# Patient Record
Sex: Female | Born: 1970 | Race: Black or African American | Hispanic: No | Marital: Single | Smoking: Never smoker
Health system: Southern US, Community
[De-identification: ages and names within clinical notes are randomized; demographics above are authoritative.]

## PROBLEM LIST (undated history)

## (undated) DIAGNOSIS — I10 Essential (primary) hypertension: Secondary | ICD-10-CM

---

## 2014-03-11 DIAGNOSIS — J302 Other seasonal allergic rhinitis: Secondary | ICD-10-CM | POA: Insufficient documentation

## 2014-03-11 DIAGNOSIS — I10 Essential (primary) hypertension: Secondary | ICD-10-CM | POA: Insufficient documentation

## 2017-09-21 DIAGNOSIS — E782 Mixed hyperlipidemia: Secondary | ICD-10-CM | POA: Insufficient documentation

## 2020-05-30 ENCOUNTER — Emergency Department (INDEPENDENT_AMBULATORY_CARE_PROVIDER_SITE_OTHER): Payer: Self-pay

## 2020-05-30 ENCOUNTER — Emergency Department (INDEPENDENT_AMBULATORY_CARE_PROVIDER_SITE_OTHER)
Admission: EM | Admit: 2020-05-30 | Discharge: 2020-05-30 | Disposition: A | Discharge status: Home / Self Care | Payer: Self-pay | Source: Home / Self Care | Attending: Family Medicine | Admitting: Family Medicine

## 2020-05-30 ENCOUNTER — Other Ambulatory Visit: Payer: Self-pay

## 2020-05-30 DIAGNOSIS — S161XXA Strain of muscle, fascia and tendon at neck level, initial encounter: Secondary | ICD-10-CM

## 2020-05-30 DIAGNOSIS — M542 Cervicalgia: Secondary | ICD-10-CM

## 2020-05-30 DIAGNOSIS — M545 Low back pain: Secondary | ICD-10-CM

## 2020-05-30 DIAGNOSIS — M79602 Pain in left arm: Secondary | ICD-10-CM

## 2020-05-30 DIAGNOSIS — S39012A Strain of muscle, fascia and tendon of lower back, initial encounter: Secondary | ICD-10-CM

## 2020-05-30 MED ORDER — CYCLOBENZAPRINE HCL 10 MG PO TABS: 10.0000 mg | ORAL_TABLET | Freq: Three times a day (TID) | ORAL | 0 refills | Status: AC

## 2020-05-30 MED ORDER — IBUPROFEN 800 MG PO TABS
800.0000 mg | ORAL_TABLET | Freq: Once | ORAL | Status: AC
  Administered 2020-05-30: 800 mg via ORAL

## 2020-05-30 NOTE — Discharge Instructions (Signed)
Apply ice pack to neck muscles and lower back for 20 to 30 minutes, 3 to 4 times daily  Continue until pain and swelling decrease.  May take Ibuprofen 200mg , 4 tabs every 8 hours with food.   Wear soft cervical collar until improved. Begin range of motion and stretching exercises as tolerated.
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
## 2020-06-17 ENCOUNTER — Ambulatory Visit (INDEPENDENT_AMBULATORY_CARE_PROVIDER_SITE_OTHER): Payer: Self-pay | Admitting: Family Medicine

## 2020-06-17 ENCOUNTER — Encounter: Payer: Self-pay | Admitting: Family Medicine

## 2020-06-17 ENCOUNTER — Other Ambulatory Visit: Payer: Self-pay

## 2020-06-17 VITALS — BP 116/81 | HR 99 | Ht 67.0 in | Wt 285.0 lb

## 2020-06-17 DIAGNOSIS — M5440 Lumbago with sciatica, unspecified side: Secondary | ICD-10-CM | POA: Insufficient documentation

## 2020-06-17 DIAGNOSIS — M545 Low back pain, unspecified: Secondary | ICD-10-CM

## 2020-06-17 DIAGNOSIS — M542 Cervicalgia: Secondary | ICD-10-CM

## 2020-06-17 MED ORDER — METHYLPREDNISOLONE ACETATE 40 MG/ML IJ SUSP: 40.0000 mg | Freq: Once | INTRAMUSCULAR | Status: AC

## 2020-06-17 MED ORDER — IBUPROFEN-FAMOTIDINE 800-26.6 MG PO TABS: 1.0000 | ORAL_TABLET | Freq: Three times a day (TID) | ORAL | 3 refills | Status: AC | PRN
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
## 2020-06-17 NOTE — Assessment & Plan Note (Signed)
Likely related to spasm.  Specific trigger points appreciated in the bases especially on the right. -Counseled on home exercise therapy and supportive care. -Referral to physical therapy. -Could consider trigger point injections.

## 2020-06-17 NOTE — Patient Instructions (Signed)
Nice to meet you Please try heat  Please try the exercises  Please try the duexis  Physical therapy will give you a call   Please send me a message in MyChart with any questions or updates.  Please see me back in 4 weeks.   --Dr. Jordan Likes

## 2020-06-17 NOTE — Progress Notes (Signed)
Medication Samples have been provided to the patient.  Drug name: Duexis       Strength: 800mg /26.6mg         Qty: 1 box  LOT:  Exp.Date: 01/2021  Dosing instructions: take 1 tablet by mouth three (3) times a day.  The patient has been instructed regarding the correct time, dose, and frequency of taking this medication, including desired effects and most common side effects.   02/2021, MA 1:13 PM 06/17/2020

## 2020-06-18 ENCOUNTER — Telehealth: Payer: Self-pay | Admitting: Family Medicine

## 2020-06-24 ENCOUNTER — Emergency Department (INDEPENDENT_AMBULATORY_CARE_PROVIDER_SITE_OTHER)
Admission: EM | Admit: 2020-06-24 | Discharge: 2020-06-24 | Disposition: A | Discharge status: Home / Self Care | Payer: Self-pay | Source: Home / Self Care | Attending: Family Medicine | Admitting: Family Medicine

## 2020-06-24 ENCOUNTER — Other Ambulatory Visit: Payer: Self-pay

## 2020-06-24 ENCOUNTER — Encounter: Payer: Self-pay | Admitting: Emergency Medicine

## 2020-06-24 ENCOUNTER — Emergency Department (INDEPENDENT_AMBULATORY_CARE_PROVIDER_SITE_OTHER): Payer: BC Managed Care – PPO

## 2020-06-24 DIAGNOSIS — M7061 Trochanteric bursitis, right hip: Secondary | ICD-10-CM

## 2020-06-24 DIAGNOSIS — M25551 Pain in right hip: Secondary | ICD-10-CM | POA: Diagnosis not present

## 2020-06-24 DIAGNOSIS — M76891 Other specified enthesopathies of right lower limb, excluding foot: Secondary | ICD-10-CM

## 2020-06-24 HISTORY — DX: Essential (primary) hypertension: I10

## 2020-06-24 MED ORDER — PREDNISONE 20 MG PO TABS: ORAL_TABLET | ORAL | 0 refills | Status: AC

## 2020-06-24 NOTE — Discharge Instructions (Signed)
Apply ice pack for 20 to 30 minutes, 3 to 4 times daily  Continue until pain decreases.  May take Extra Strength Tylenol 2 to 3 times daily as needed. Begin range of motion and stretching exercises as tolerated.
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
## 2020-06-24 NOTE — ED Triage Notes (Signed)
Follow up from [facility] on 8/19 - initially had back pain  Saw sports med doctor at [facility] - referred for PT  1st appointment is on Monday Now has increased R leg pain - spasm in her hip radiates to thigh Motrin 800mg  q 8 hours - min relief NO COVID vaccine

## 2020-06-25 ENCOUNTER — Encounter: Payer: Self-pay | Admitting: Family Medicine

## 2020-06-25 ENCOUNTER — Other Ambulatory Visit: Payer: Self-pay

## 2020-06-25 ENCOUNTER — Ambulatory Visit (INDEPENDENT_AMBULATORY_CARE_PROVIDER_SITE_OTHER): Payer: Self-pay | Admitting: Family Medicine

## 2020-06-25 VITALS — BP 128/81 | HR 94 | Ht 67.0 in | Wt 285.0 lb

## 2020-06-25 DIAGNOSIS — M5441 Lumbago with sciatica, right side: Secondary | ICD-10-CM

## 2020-06-25 NOTE — Progress Notes (Signed)
Renee Mcgrath - 49 y.o. female MRN [id number]  Date of birth: 08/09/1971  SUBJECTIVE:  Including CC & ROS.  Chief Complaint  Patient presents with  . Follow-up    right-sided low back    Renee Mcgrath is a 49 y.o. female that is presenting with right leg pain.  She is seen in urgent care yesterday and had normal x-rays.  She was prescribed prednisone has noticed a significant improvement of her pain.  She was having lateral hip pain with radicular symptoms down the leg.  She denies any significant pain today.  Is feeling much improved compared to yesterday..  Independent review of the right hip x-ray and AP of the pelvis shows no acute abnormality.  Review of Systems See HPI   HISTORY: Past Medical, Surgical, Social, and Family History Reviewed & Updated per EMR.   Pertinent Historical Findings include:  Past Medical History:  Diagnosis Date  . Hypertension     No past surgical history on file.  Family History  Problem Relation Age of Onset  . Cancer Mother   . Multiple sclerosis Father   . Stroke Father     Social History   Socioeconomic History  . Marital status: Single    Spouse name: Not on file  . Number of children: Not on file  . Years of education: Not on file  . Highest education level: Not on file  Occupational History  . Not on file  Tobacco Use  . Smoking status: Never Smoker  . Smokeless tobacco: Never Used  Vaping Use  . Vaping Use: Never used  Substance and Sexual Activity  . Alcohol use: Not Currently  . Drug use: Never  . Sexual activity: Not on file  Other Topics Concern  . Not on file  Social History Narrative  . Not on file   Social Determinants of Health   Financial Resource Strain:   . Difficulty of Paying Living Expenses: Not on file  Food Insecurity:   . Worried About Programme researcher, broadcasting/film/video in the Last Year: Not on file  . Ran Out of Food in the Last Year: Not on file  Transportation Needs:   . Lack of Transportation  (Medical): Not on file  . Lack of Transportation (Non-Medical): Not on file  Physical Activity:   . Days of Exercise per Week: Not on file  . Minutes of Exercise per Session: Not on file  Stress:   . Feeling of Stress : Not on file  Social Connections:   . Frequency of Communication with Friends and Family: Not on file  . Frequency of Social Gatherings with Friends and Family: Not on file  . Attends Religious Services: Not on file  . Active Member of Clubs or Organizations: Not on file  . Attends Banker Meetings: Not on file  . Marital Status: Not on file  Intimate Partner Violence:   . Fear of Current or Ex-Partner: Not on file  . Emotionally Abused: Not on file  . Physically Abused: Not on file  . Sexually Abused: Not on file     PHYSICAL EXAM:  VS: BP 128/81   Pulse 94   Ht 5\' 7"  (1.702 m)   Wt 285 lb (129.3 kg)   LMP 06/10/2020 (Approximate)   BMI 44.64 kg/m  Physical Exam Gen: NAD, alert, cooperative with exam, well-appearing  ASSESSMENT & PLAN:   Acute bilateral low back pain with sciatica Pain appears to be more radicular nature on the  right side that improved with the prednisone use. -Counseled on supportive care. -Has physical therapy scheduled. -We will plan on scheduled follow-up.

## 2020-06-25 NOTE — Assessment & Plan Note (Signed)
Pain appears to be more radicular nature on the right side that improved with the prednisone use. -Counseled on supportive care. -Has physical therapy scheduled. -We will plan on scheduled follow-up.

## 2020-06-30 ENCOUNTER — Ambulatory Visit (INDEPENDENT_AMBULATORY_CARE_PROVIDER_SITE_OTHER): Payer: Self-pay | Admitting: Rehabilitative and Restorative Service Providers"

## 2020-06-30 ENCOUNTER — Other Ambulatory Visit: Payer: Self-pay

## 2020-06-30 ENCOUNTER — Encounter: Payer: Self-pay | Admitting: Rehabilitative and Restorative Service Providers"

## 2020-06-30 DIAGNOSIS — R293 Abnormal posture: Secondary | ICD-10-CM

## 2020-06-30 DIAGNOSIS — M542 Cervicalgia: Secondary | ICD-10-CM

## 2020-06-30 DIAGNOSIS — M5441 Lumbago with sciatica, right side: Secondary | ICD-10-CM

## 2020-06-30 DIAGNOSIS — R29898 Other symptoms and signs involving the musculoskeletal system: Secondary | ICD-10-CM
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
## 2020-06-30 NOTE — Patient Instructions (Signed)
Access Code: [email or web address]: [email or web address]: 09/20/2021Prepared by: Karely Hurtado HoltExercises  Supine Piriformis Stretch with Leg Straight - 2 x daily - 7 x weekly - 1 sets - 3 reps - 30 sec hold  Hooklying Hamstring Stretch with Strap - 2 x daily - 7 x weekly - 1 sets - 3 reps - 30 sec hold  Supine ITB Stretch with Strap - 2 x daily - 7 x weekly - 1 sets - 3 reps - 30 sec hold  Prone Quadriceps Stretch with Strap - 2 x daily - 7 x weekly - 1 sets - 3 reps - 30 sec hold  Baby Cobra Hands Down - 2 x daily - 7 x weekly - 1 sets - 5-10 reps - 2-3 sec hold  Seated Cervical Retraction - 2 x daily - 7 x weekly - 1-2 sets - 5-10 reps - 10 sec hold  Seated Cervical Sidebending AROM - 2 x daily - 7 x weekly - 1 sets - 5 reps - 5-10 sec hold Patient Education  TENS Unit

## 2020-07-02 ENCOUNTER — Ambulatory Visit (INDEPENDENT_AMBULATORY_CARE_PROVIDER_SITE_OTHER): Payer: BC Managed Care – PPO | Admitting: Rehabilitative and Restorative Service Providers"

## 2020-07-02 ENCOUNTER — Other Ambulatory Visit: Payer: Self-pay

## 2020-07-02 ENCOUNTER — Encounter: Payer: Self-pay | Admitting: Rehabilitative and Restorative Service Providers"

## 2020-07-02 DIAGNOSIS — M5441 Lumbago with sciatica, right side: Secondary | ICD-10-CM

## 2020-07-02 DIAGNOSIS — R29898 Other symptoms and signs involving the musculoskeletal system: Secondary | ICD-10-CM

## 2020-07-02 DIAGNOSIS — R293 Abnormal posture: Secondary | ICD-10-CM

## 2020-07-02 DIAGNOSIS — M542 Cervicalgia: Secondary | ICD-10-CM | POA: Diagnosis not present

## 2020-07-02 NOTE — Therapy (Signed)
[facility] [place] [address] [place], [place], [address] Phone: [phone]   Fax:  [phone]  Physical Therapy Treatment  Patient Details  Name: Renee Mcgrath MRN: [id number] Date of Birth: November 02, 1970 Referring Provider (PT): Dr Clare Gandy   Encounter Date: 07/02/2020   PT End of Session - 07/02/20 1522    Visit Number 2    Number of Visits 12    Date for PT Re-Evaluation 08/11/20    PT Start Time 1521    PT Stop Time 1610    PT Time Calculation (min) 49 min    Activity Tolerance Patient tolerated treatment well           Past Medical History:  Diagnosis Date  . Hypertension     History reviewed. No pertinent surgical history.  There were no vitals filed for this visit.   Subjective Assessment - 07/02/20 1525    Subjective Patient reports that she had a lot of soreness following initial evaluation. She has not done her exercises due to late work night yesterday. Still having a lot of popping and cracking in the neck with movement.    Currently in Pain? Yes    Pain Score 4     Pain Location Back    Pain Orientation Right;Lower    Pain Descriptors / Indicators Aching;Nagging    Pain Score 6    Pain Location Neck    Pain Orientation Right;Left;Lower    Pain Descriptors / Indicators Dull;Aching;Sharp   occasional sharp pain with movement                            [facility] Adult PT Treatment/Exercise - 07/02/20 0001      Self-Care   Self-Care --   transition sit to supine via sidelying      Therapeutic Activites    Therapeutic Activities --   myofacial ball release upper body/LB/posterior hip standing      Neck Exercises: Standing   Neck Retraction 5 reps;5 secs   back along noodle      Lumbar Exercises: Stretches   Passive Hamstring Stretch Right;Left;2 reps;30 seconds   supine with strap    Hip Flexor Stretch Right;Left;2 reps;30 seconds   seated   Press Ups 5 reps   2-3 sec  hold - limited to pt tolerance    Quad Stretch Right;2 reps;30 seconds   prone with strap    ITB Stretch Right;2 reps;30 seconds   supine with strap    Piriformis Stretch Right;2 reps;30 seconds   supine travell stretch      Lumbar Exercises: Aerobic   Nustep L5 x 5 min UE 10       Shoulder Exercises: Standing   Retraction AROM;Strengthening;Both;10 reps   10 sec hold with noodle    Other Standing Exercises shoulder rolls backwards x 20       Shoulder Exercises: Stretch   Other Shoulder Stretches doorway stretch 2 lower positions 30 sec x 2 reps; higher position 30 sec x 2 reps just resting arms on doorway       Moist Heat Therapy   Number Minutes Moist Heat 10 Minutes    Moist Heat Location Cervical;Lumbar Spine      Electrical Stimulation   Electrical Stimulation Location bilat cervical/upper trap      Electrical Stimulation Action TENs     Electrical Stimulation Parameters to tolerance    Electrical Stimulation Goals Pain;Tone  Neck Exercises: Stretches   Other Neck Stretches lateral cervical flexion 5 sec hold x 3 reps each side                   PT Education - 07/02/20 1539    Education Details HEP    Person(s) Educated Patient    Methods Explanation;Demonstration;Tactile cues;Verbal cues;Handout    Comprehension Verbalized understanding;Returned demonstration;Verbal cues required;Tactile cues required               PT Long Term Goals - 06/30/20 1307      PT LONG TERM GOAL #1   Title Improve cervical and lumbar ROM to 60-75% in all ranges with minimal to no pain    Time 6    Period Weeks    Status New    Target Date 08/11/20      PT LONG TERM GOAL #2   Title Decrease pain to no more than 2/10 pain on the 0-10 pain scape with functional activities at work and home    Time 6    Period Weeks    Status New    Target Date 08/11/20      PT LONG TERM GOAL #3   Title Patient to report ability to sit or stand for 30-60 min with minimal to no  increase pain    Time 6    Period Weeks    Status New    Target Date 08/11/20      PT LONG TERM GOAL #4   Title Independent in HEP    Time 6    Period Weeks    Status New    Target Date 08/11/20      PT LONG TERM GOAL #5   Title Improve FOTO to </= 39% limitation    Time 6    Period Weeks    Status New    Target Date 08/11/20                 Plan - 07/02/20 1528    Clinical Impression Statement Persistent pain in Rt > Lt cervical and shoudler girdle areas as well as Rt lumbar to posterior hip and anterior thigh. Patient has not worked on her exercises at home due to work schedule. Reviewed exercises today and added exercises without difficulty. Trial of TENS for home use for pain management.    Rehab Potential Good    PT Frequency 2x / week    PT Duration 6 weeks    PT Treatment/Interventions ADLs/Self Care Home Management;Aquatic Therapy;Cryotherapy;Electrical Stimulation;Iontophoresis 4mg /ml Dexamethasone;Moist Heat;Ultrasound;Gait training;Stair training;Functional mobility training;Therapeutic activities;Therapeutic exercise;Balance training;Neuromuscular re-education;Patient/family education;Manual techniques;Passive range of motion;Taping    PT Next Visit Plan review HEP; progress with core strengthening and stabilization; modalities as indicated - trial of TENS for home use; manual work as indicated    PT Home Exercise Plan [email or web address]    Consulted and Agree with Plan of Care Patient           Patient will benefit from skilled therapeutic intervention in order to improve the following deficits and impairments:     Visit Diagnosis: Acute bilateral low back pain with right-sided sciatica  Cervicalgia  Other symptoms and signs involving the musculoskeletal system  Abnormal posture     Problem List Patient Active Problem List   Diagnosis Date Noted  . Acute bilateral low back pain with sciatica 06/17/2020  . Neck pain 06/17/2020  . Mixed hyperlipidemia  09/21/2017  . Benign essential hypertension 03/11/2014  . Seasonal allergic rhinitis  03/11/2014  . Plantar fasciitis of left foot 03/30/2013    Kaylea Mounsey Rober Minion PT, MPH  07/02/2020, 4:07 PM  [facility] [address] [place], [place], [address] Phone: [phone]   Fax:  [phone]  Name: Renee Mcgrath MRN: [id number] Date of Birth: [birth year]

## 2020-07-07 ENCOUNTER — Encounter: Payer: Self-pay | Admitting: Physical Therapy

## 2020-07-07 ENCOUNTER — Ambulatory Visit: Payer: BC Managed Care – PPO | Admitting: Physical Therapy

## 2020-07-07 ENCOUNTER — Other Ambulatory Visit: Payer: Self-pay

## 2020-07-07 DIAGNOSIS — R293 Abnormal posture: Secondary | ICD-10-CM | POA: Diagnosis not present

## 2020-07-07 DIAGNOSIS — M542 Cervicalgia: Secondary | ICD-10-CM | POA: Diagnosis not present

## 2020-07-07 DIAGNOSIS — M5441 Lumbago with sciatica, right side: Secondary | ICD-10-CM | POA: Diagnosis not present

## 2020-07-07 DIAGNOSIS — R29898 Other symptoms and signs involving the musculoskeletal system: Secondary | ICD-10-CM | POA: Diagnosis not present

## 2020-07-07 NOTE — Therapy (Signed)
[place], [place], [address] Phone: [phone]   Fax:  [phone]  Physical Therapy Treatment  Patient Details  Name: Renee Mcgrath MRN: [id number] Date of Birth: 12/21/1970 Referring Provider (PT): Dr Clearance Coots   Encounter Date: 07/07/2020   PT End of Session - 07/07/20 0810    Visit Number 3    Number of Visits 12    Date for PT Re-Evaluation 08/11/20    PT Start Time 0807    PT Stop Time 0846    PT Time Calculation (min) 39 min    Activity Tolerance Patient tolerated treatment well;No increased pain    Behavior During Therapy WFL for tasks assessed/performed           Past Medical History:  Diagnosis Date  . Hypertension     History reviewed. No pertinent surgical history.  There were no vitals filed for this visit.   Subjective Assessment - 07/07/20 0810    Subjective Pt reports she has done her exercises, but hasn't noticed any changes in pain yet. She didn't sleep well last night. The TENS relaxed her, but she hasn't ordered it yet. She is thinking of returning to [place] and yoga classes.    Pertinent History denies any medical or musculoskeletal problems    Patient Stated Goals get rid of the pain and return to PLOF    Currently in Pain? Yes    Pain Score 4     Pain Location Back    Pain Orientation Lower    Pain Descriptors / Indicators Aching    Aggravating Factors  prolonged sitting or standing; bending    Pain Relieving Factors meds, heat    Pain Score 3    Pain Location Neck    Pain Orientation Right;Left;Lower    Pain Descriptors / Indicators Aching;Dull    Aggravating Factors  end of the day; reaching, bending    Pain Relieving Factors medications              [facility] PT Assessment - 07/07/20 0001      Assessment   Medical Diagnosis LBP with Rt LE pain; cervicalgia     Referring Provider (PT) Dr Clearance Coots    Onset Date/Surgical Date 05/29/20      Hand Dominance Right    Next MD Visit 07/17/20    Prior Therapy none              [facility] Adult PT Treatment/Exercise - 07/07/20 0001      Lumbar Exercises: Stretches   Passive Hamstring Stretch Right;Left;2 reps;20 seconds   seated   Quad Stretch Right;1 rep;20 seconds   trial of seated with foot under chair   Quad Stretch Limitations bother neck;  stopped   Piriformis Stretch Right;Left;2 reps   modified pigeon pose on table   Other Lumbar Stretch Exercise L stretch at counter x 10 sec, seated bilat shoulder flexion x 10 sec.       Lumbar Exercises: Aerobic   Nustep L5 x 6.5 min UE 10       Shoulder Exercises: Seated   Row Both;10 reps;Strengthening   3-5 sec hold in retraction   Theraband Level (Shoulder Row) Level 2 (Red)    Row Limitations cues for form       Shoulder Exercises: Stretch   Other Shoulder Stretches 3 position doorway stretch x 20 sec x 2 reps each position      Modalities   Modalities --  deferred due to time.                       PT Long Term Goals - 06/30/20 1307      PT LONG TERM GOAL #1   Title Improve cervical and lumbar ROM to 60-75% in all ranges with minimal to no pain    Time 6    Period Weeks    Status New    Target Date 08/11/20      PT LONG TERM GOAL #2   Title Decrease pain to no more than 2/10 pain on the 0-10 pain scape with functional activities at work and home    Time 6    Period Weeks    Status New    Target Date 08/11/20      PT LONG TERM GOAL #3   Title Patient to report ability to sit or stand for 30-60 min with minimal to no increase pain    Time 6    Period Weeks    Status New    Target Date 08/11/20      PT LONG TERM GOAL #4   Title Independent in HEP    Time 6    Period Weeks    Status New    Target Date 08/11/20      PT LONG TERM GOAL #5   Title Improve FOTO to </= 39% limitation    Time 6    Period Weeks    Status New    Target Date 08/11/20                 Plan - 07/07/20  0831    Clinical Impression Statement Pt shown variations on stretches to incorporate into her work day; all were tolerated well.  She reported slight reduction in discomfort after performing exercises.  Minor cues on form of exercises required. Goals are ongoing.    Comorbidities cervical and shoulder pain; LBP with radiculopathy; obesity    Examination-Activity Limitations Lift;Stand;Squat;Sit;Sleep;Reach Overhead    Rehab Potential Good    PT Frequency 2x / week    PT Duration 6 weeks    PT Treatment/Interventions ADLs/Self Care Home Management;Aquatic Therapy;Cryotherapy;Electrical Stimulation;Iontophoresis 4mg /ml Dexamethasone;Moist Heat;Ultrasound;Gait training;Stair training;Functional mobility training;Therapeutic activities;Therapeutic exercise;Balance training;Neuromuscular re-education;Patient/family education;Manual techniques;Passive range of motion;Taping    PT Next Visit Plan progress with core strengthening and stabilization; modalities as indicated.    PT Home Exercise Plan [email or web address]    Consulted and Agree with Plan of Care Patient           Patient will benefit from skilled therapeutic intervention in order to improve the following deficits and impairments:  Decreased range of motion, Increased muscle spasms, Obesity, Decreased activity tolerance, Pain, Hypomobility, Impaired flexibility, Improper body mechanics, Decreased mobility, Decreased strength, Impaired sensation, Postural dysfunction  Visit Diagnosis: Acute bilateral low back pain with right-sided sciatica  Cervicalgia  Other symptoms and signs involving the musculoskeletal system  Abnormal posture     Problem List Patient Active Problem List   Diagnosis Date Noted  . Acute bilateral low back pain with sciatica 06/17/2020  . Neck pain 06/17/2020  . Mixed hyperlipidemia 09/21/2017  . Benign essential hypertension 03/11/2014  . Seasonal allergic rhinitis 03/11/2014  . Plantar fasciitis of left foot  03/30/2013   Kerin Perna, PTA 07/07/20 8:58 AM  [facility] [place], [place], [address] Phone: [phone]   Fax:  [phone]  Name: SARANNE CRISLIP MRN: [id number]  Date of Birth: 13-Jul-1971

## 2020-07-09 ENCOUNTER — Encounter: Payer: Self-pay | Admitting: Rehabilitative and Restorative Service Providers"

## 2020-07-09 ENCOUNTER — Other Ambulatory Visit: Payer: Self-pay

## 2020-07-09 ENCOUNTER — Ambulatory Visit (INDEPENDENT_AMBULATORY_CARE_PROVIDER_SITE_OTHER): Payer: BC Managed Care – PPO | Admitting: Rehabilitative and Restorative Service Providers"

## 2020-07-09 DIAGNOSIS — R29898 Other symptoms and signs involving the musculoskeletal system: Secondary | ICD-10-CM

## 2020-07-09 DIAGNOSIS — M542 Cervicalgia: Secondary | ICD-10-CM | POA: Diagnosis not present

## 2020-07-09 DIAGNOSIS — R293 Abnormal posture: Secondary | ICD-10-CM

## 2020-07-09 DIAGNOSIS — M5441 Lumbago with sciatica, right side: Secondary | ICD-10-CM

## 2020-07-09 NOTE — Patient Instructions (Signed)
Access Code: [email or web address]: [email or web address]: 09/29/2021Prepared by: Kareena Arrambide HoltExercises  Supine Piriformis Stretch with Leg Straight - 2 x daily - 7 x weekly - 1 sets - 3 reps - 30 sec hold  Hooklying Hamstring Stretch with Strap - 2 x daily - 7 x weekly - 1 sets - 3 reps - 30 sec hold  Supine ITB Stretch with Strap - 2 x daily - 7 x weekly - 1 sets - 3 reps - 30 sec hold  Prone Quadriceps Stretch with Strap - 2 x daily - 7 x weekly - 1 sets - 3 reps - 30 sec hold  Baby Cobra Hands Down - 2 x daily - 7 x weekly - 1 sets - 5-10 reps - 2-3 sec hold  Seated Cervical Retraction - 2 x daily - 7 x weekly - 1-2 sets - 5-10 reps - 10 sec hold  Seated Cervical Sidebending AROM - 2 x daily - 7 x weekly - 1 sets - 5 reps - 5-10 sec hold  Doorway Pec Stretch at 60 Degrees Abduction - 3 x daily - 7 x weekly - 3 reps - 1 sets  Doorway Pec Stretch at 90 Degrees Abduction - 3 x daily - 7 x weekly - 3 reps - 1 sets - 30 seconds hold  Doorway Pec Stretch at 120 Degrees Abduction - 3 x daily - 7 x weekly - 3 reps - 1 sets - 30 second hold hold  Seated Scapular Retraction - 2 x daily - 7 x weekly - 1-2 sets - 10 reps - 10 sec hold  Standing Shoulder External Rotation with Resistance - 2 x daily - 7 x weekly - 1-3 sets - 10 reps - 2-3 sec hold  Standing Bilateral Low Shoulder Row with Anchored Resistance - 2 x daily - 7 x weekly - 1-3 sets - 10 reps - 2-3 sec hold  Shoulder Extension with Resistance - 2 x daily - 7 x weekly - 1-3 sets - 10 reps - 2-3 sec hold  Supine March with Alternating Leg Lifts - 2 x daily - 7 x weekly - 1 sets - 10 reps - 2 sec hold  Supine Alternating Shoulder Flexion - 2 x daily - 7 x weekly - 1-2 sets - 10 reps - 2 sec hold  Dead Bug - 2 x daily - 7 x weekly - 1-2 sets - 10 reps - 2 sec hold  Seated Sidebending - 2 x daily - 7 x weekly - 1 sets - 3 reps - 30 sec hold  Prone Quadriceps Stretch with Strap - 2 x daily - 7 x weekly - 1 sets - 3 reps - 30 sec hold

## 2020-07-09 NOTE — Therapy (Signed)
[facility] [place] [address] [place], [place], [address] Phone: [phone]   Fax:  [phone]  Physical Therapy Treatment  Patient Details  Name: Renee Mcgrath MRN: [id number] Date of Birth: [birth year] Referring Provider (PT): Dr Clare Gandy   Encounter Date: 07/09/2020   PT End of Session - 07/09/20 1509    Visit Number 4    Number of Visits 12    Date for PT Re-Evaluation 08/11/20    PT Start Time 1509    PT Stop Time 1559    PT Time Calculation (min) 50 min    Activity Tolerance Patient tolerated treatment well;No increased pain           Past Medical History:  Diagnosis Date  . Hypertension     History reviewed. No pertinent surgical history.  There were no vitals filed for this visit.   Subjective Assessment - 07/09/20 1510    Subjective Had some leg pain last night. Has not had leg pain since she took the steroids. She does not know of anything different yesterday that may have effected the leg pain. It lasted for about an hour and it was resolved by morning. Exercises are going OK. Back feels "pretty good" today. She has some occasional neck pain and shoulder tightness. Overall making progress. Has not ordered TENS unit.    Currently in Pain? Yes    Pain Score 3     Pain Location Back    Pain Orientation Lower    Pain Descriptors / Indicators Aching    Pain Score 2    Pain Location Neck    Pain Orientation Right;Left;Lower    Pain Descriptors / Indicators Aching;Dull    Pain Type Acute pain                             [facility] Adult PT Treatment/Exercise - 07/09/20 0001      Lumbar Exercises: Stretches   Passive Hamstring Stretch Right;Left;2 reps;20 seconds   seated   Quad Stretch Right;Left;2 reps;30 seconds   prone with strap    ITB Stretch Right;Left;2 reps;30 seconds    Piriformis Stretch Right;Left;2 reps   modified pigeon pose on table   Other Lumbar Stretch Exercise  lateral trunk flexion in sitting 15-20 sec hold x 3 reps       Lumbar Exercises: Aerobic   Nustep L5 x 6 min UE 10     Other Aerobic Exercise 2 laps around gym end of treatment       Lumbar Exercises: Supine   Bent Knee Raise 5 reps    Dead Bug 10 reps;2 seconds    Bridge Limitations increased pain Rt posterior hip     Other Supine Lumbar Exercises clam w/ green TB alternate hip abduction in hooklying     Other Supine Lumbar Exercises alternate shoulder flexion in supine x 10       Shoulder Exercises: Standing   Extension Strengthening;Both;10 reps;Theraband    Theraband Level (Shoulder Extension) Level 3 (Green)    Row Strengthening;Both;10 reps;Theraband    Theraband Level (Shoulder Row) Level 3 (Green)    Retraction Strengthening;Both;10 reps;Theraband    Theraband Level (Shoulder Retraction) Level 2 (Red)      Shoulder Exercises: Stretch   Other Shoulder Stretches 3 position doorway stretch x 20 sec x 2 reps each position      Moist Heat Therapy   Number Minutes Moist Heat 10 Minutes  Moist Heat Location Cervical;Lumbar Spine      Electrical Stimulation   Electrical Stimulation Location Rt thoracic spine/shoulder girdle area     Electrical Stimulation Action TENS    Electrical Stimulation Parameters to tolerance     Electrical Stimulation Goals Pain;Tone                  PT Education - 07/09/20 1550    Education Details HEP    Person(s) Educated Patient    Methods Explanation;Demonstration;Tactile cues;Verbal cues;Handout    Comprehension Verbalized understanding;Returned demonstration;Verbal cues required;Tactile cues required               PT Long Term Goals - 06/30/20 1307      PT LONG TERM GOAL #1   Title Improve cervical and lumbar ROM to 60-75% in all ranges with minimal to no pain    Time 6    Period Weeks    Status New    Target Date 08/11/20      PT LONG TERM GOAL #2   Title Decrease pain to no more than 2/10 pain on the 0-10 pain  scape with functional activities at work and home    Time 6    Period Weeks    Status New    Target Date 08/11/20      PT LONG TERM GOAL #3   Title Patient to report ability to sit or stand for 30-60 min with minimal to no increase pain    Time 6    Period Weeks    Status New    Target Date 08/11/20      PT LONG TERM GOAL #4   Title Independent in HEP    Time 6    Period Weeks    Status New    Target Date 08/11/20      PT LONG TERM GOAL #5   Title Improve FOTO to </= 39% limitation    Time 6    Period Weeks    Status New    Target Date 08/11/20                 Plan - 07/09/20 1537    Clinical Impression Statement Some Rt anterior thigh pain last night. Resolved with rest. Added stretches and resistive exercises without difficulty. issued TB for home and work.    Rehab Potential Good    PT Frequency 2x / week    PT Duration 6 weeks    PT Treatment/Interventions ADLs/Self Care Home Management;Aquatic Therapy;Cryotherapy;Electrical Stimulation;Iontophoresis 4mg /ml Dexamethasone;Moist Heat;Ultrasound;Gait training;Stair training;Functional mobility training;Therapeutic activities;Therapeutic exercise;Balance training;Neuromuscular re-education;Patient/family education;Manual techniques;Passive range of motion;Taping    PT Next Visit Plan progress with core strengthening and stabilization; modalities as indicated.    PT Home Exercise Plan [email or web address]    Consulted and Agree with Plan of Care Patient           Patient will benefit from skilled therapeutic intervention in order to improve the following deficits and impairments:     Visit Diagnosis: Acute bilateral low back pain with right-sided sciatica  Cervicalgia  Other symptoms and signs involving the musculoskeletal system  Abnormal posture     Problem List Patient Active Problem List   Diagnosis Date Noted  . Acute bilateral low back pain with sciatica 06/17/2020  . Neck pain 06/17/2020  . Mixed  hyperlipidemia 09/21/2017  . Benign essential hypertension 03/11/2014  . Seasonal allergic rhinitis 03/11/2014  . Plantar fasciitis of left foot 03/30/2013    Tita Terhaar P Breckyn Ticas PT,  MPH  07/09/2020, 4:01 PM  [facility] [address] [place], [place], [address] Phone: [phone]   Fax:  [phone]  Name: KYRSTIN CAMPILLO MRN: [id number] Date of Birth: [birth year]

## 2020-07-14 ENCOUNTER — Encounter: Payer: Self-pay | Admitting: Rehabilitative and Restorative Service Providers"

## 2020-07-14 ENCOUNTER — Other Ambulatory Visit: Payer: Self-pay

## 2020-07-14 ENCOUNTER — Ambulatory Visit (INDEPENDENT_AMBULATORY_CARE_PROVIDER_SITE_OTHER): Payer: BC Managed Care – PPO | Admitting: Rehabilitative and Restorative Service Providers"

## 2020-07-14 DIAGNOSIS — R293 Abnormal posture: Secondary | ICD-10-CM

## 2020-07-14 DIAGNOSIS — R29898 Other symptoms and signs involving the musculoskeletal system: Secondary | ICD-10-CM | POA: Diagnosis not present

## 2020-07-14 DIAGNOSIS — M5441 Lumbago with sciatica, right side: Secondary | ICD-10-CM | POA: Diagnosis not present

## 2020-07-14 DIAGNOSIS — M542 Cervicalgia: Secondary | ICD-10-CM | POA: Diagnosis not present

## 2020-07-14 NOTE — Therapy (Signed)
[facility] [place] [address] [place], [place], [address] Phone: [phone]   Fax:  [phone]  Physical Therapy Treatment  Patient Details  Name: SIENA POEHLER MRN: [id number] Date of Birth: October 14, 1970 Referring Provider (PT): Dr Clare Gandy   Encounter Date: 07/14/2020   PT End of Session - 07/14/20 1612    Visit Number 5    Number of Visits 12    Date for PT Re-Evaluation 08/11/20    PT Start Time 1609    PT Stop Time 1654    PT Time Calculation (min) 45 min    Activity Tolerance Patient tolerated treatment well;No increased pain           Past Medical History:  Diagnosis Date  . Hypertension     History reviewed. No pertinent surgical history.  There were no vitals filed for this visit.   Subjective Assessment - 07/14/20 1613    Subjective Patient reports that she has increased tightness and pain in the midback - may be from increased time at the computer.    Currently in Pain? No/denies    Pain Score 0-No pain    Pain Location Back    Pain Score 5    Pain Location Neck    Pain Orientation Mid;Right;Left    Pain Descriptors / Indicators Aching;Dull    Pain Radiating Towards upper trap Rt > Lt and thoracic area                             [facility] Adult PT Treatment/Exercise - 07/14/20 0001      Therapeutic Activites    Therapeutic Activities --   myofacial ball release work through thoracic spine standing     Lumbar Exercises: Prone   Other Prone Lumbar Exercises thoracic extension UE sec x 10; W 5 sec x 5; airplane 5 sec x 5 cervical spine in neutral position 's at side       Shoulder Exercises: Seated   Other Seated Exercises thoracic extension hands behind head leaning back 10 - 15 sec x 3 reps       Moist Heat Therapy   Number Minutes Moist Heat 10 Minutes    Moist Heat Location --   thoracic      Electrical Stimulation   Electrical Stimulation Location bilat thoracic  spine/shoulder girdle area     Electrical Stimulation Action IFC     Electrical Stimulation Parameters to tolerance    Electrical Stimulation Goals Pain;Tone      Manual Therapy   Manual therapy comments pt prone     Soft tissue mobilization deep tissue work through the thoracic paraspinal musculature; medial scapular musculature                   PT Education - 07/14/20 1620    Education Details DN HEP    Person(s) Educated Patient    Methods Explanation;Demonstration;Tactile cues;Verbal cues;Handout    Comprehension Verbalized understanding;Returned demonstration;Verbal cues required;Tactile cues required               PT Long Term Goals - 06/30/20 1307      PT LONG TERM GOAL #1   Title Improve cervical and lumbar ROM to 60-75% in all ranges with minimal to no pain    Time 6    Period Weeks    Status New    Target Date 08/11/20      PT LONG TERM  GOAL #2   Title Decrease pain to no more than 2/10 pain on the 0-10 pain scape with functional activities at work and home    Time 6    Period Weeks    Status New    Target Date 08/11/20      PT LONG TERM GOAL #3   Title Patient to report ability to sit or stand for 30-60 min with minimal to no increase pain    Time 6    Period Weeks    Status New    Target Date 08/11/20      PT LONG TERM GOAL #4   Title Independent in HEP    Time 6    Period Weeks    Status New    Target Date 08/11/20      PT LONG TERM GOAL #5   Title Improve FOTO to </= 39% limitation    Time 6    Period Weeks    Status New    Target Date 08/11/20                 Plan - 07/14/20 1620    Clinical Impression Statement Increasd pain in the neck and shoulder area and especially in the mid back from sitting at computer more today at work.    Rehab Potential Good    PT Frequency 2x / week    PT Duration 6 weeks    PT Treatment/Interventions ADLs/Self Care Home Management;Aquatic Therapy;Cryotherapy;Electrical  Stimulation;Iontophoresis 4mg /ml Dexamethasone;Moist Heat;Ultrasound;Gait training;Stair training;Functional mobility training;Therapeutic activities;Therapeutic exercise;Balance training;Neuromuscular re-education;Patient/family education;Manual techniques;Passive range of motion;Taping    PT Next Visit Plan progress with core strengthening and stabilization; modalities as indicated; possible trial of DN    PT Home Exercise Plan [email or web address]    Consulted and Agree with Plan of Care Patient           Patient will benefit from skilled therapeutic intervention in order to improve the following deficits and impairments:     Visit Diagnosis: Acute bilateral low back pain with right-sided sciatica  Cervicalgia  Other symptoms and signs involving the musculoskeletal system  Abnormal posture     Problem List Patient Active Problem List   Diagnosis Date Noted  . Acute bilateral low back pain with sciatica 06/17/2020  . Neck pain 06/17/2020  . Mixed hyperlipidemia 09/21/2017  . Benign essential hypertension 03/11/2014  . Seasonal allergic rhinitis 03/11/2014  . Plantar fasciitis of left foot 03/30/2013    Mckyla Deckman 04/01/2013 PT, MPH  07/14/2020, 4:46 PM  [facility] [address] [place], [place], [place] Phone: [phone]   Fax:  [phone]  Name: STARKISHA TULLIS MRN: Lenetta Quaker Date of Birth: August 01, 1971

## 2020-07-14 NOTE — Patient Instructions (Addendum)
Trigger Point Dry Needling  . What is Trigger Point Dry Needling (DN)? o DN is a physical therapy technique used to treat muscle pain and dysfunction. Specifically, DN helps deactivate muscle trigger points (muscle knots).  o A thin filiform needle is used to penetrate the skin and stimulate the underlying trigger point. The goal is for a local twitch response (LTR) to occur and for the trigger point to relax. No medication of any kind is injected during the procedure.   . What Does Trigger Point Dry Needling Feel Like?  o The procedure feels different for each individual patient. Some patients report that they do not actually feel the needle enter the skin and overall the process is not painful. Very mild bleeding may occur. However, many patients feel a deep cramping in the muscle in which the needle was inserted. This is the local twitch response.   Marland Kitchen How Will I feel after the treatment? o Soreness is normal, and the onset of soreness may not occur for a few hours. Typically this soreness does not last longer than two days.  o Bruising is uncommon, however; ice can be used to decrease any possible bruising.  o In rare cases feeling tired or nauseous after the treatment is normal. In addition, your symptoms may get worse before they get better, this period will typically not last longer than 24 hours.   . What Can I do After My Treatment? o Increase your hydration by drinking more water for the next 24 hours. o You may place ice or heat on the areas treated that have become sore, however, do not use heat on inflamed or bruised areas. Heat often brings more relief post needling. o You can continue your regular activities, but vigorous activity is not recommended initially after the treatment for 24 hours. o DN is best combined with other physical therapy such as strengthening, stretching, and other therapies.  23VARELC

## 2020-07-16 ENCOUNTER — Ambulatory Visit (INDEPENDENT_AMBULATORY_CARE_PROVIDER_SITE_OTHER): Payer: BC Managed Care – PPO | Admitting: Physical Therapy

## 2020-07-16 ENCOUNTER — Other Ambulatory Visit: Payer: Self-pay

## 2020-07-16 ENCOUNTER — Encounter: Payer: Self-pay | Admitting: Physical Therapy

## 2020-07-16 DIAGNOSIS — M542 Cervicalgia: Secondary | ICD-10-CM | POA: Diagnosis not present

## 2020-07-16 DIAGNOSIS — M5441 Lumbago with sciatica, right side: Secondary | ICD-10-CM | POA: Diagnosis not present

## 2020-07-16 DIAGNOSIS — R29898 Other symptoms and signs involving the musculoskeletal system: Secondary | ICD-10-CM | POA: Diagnosis not present

## 2020-07-16 DIAGNOSIS — R293 Abnormal posture: Secondary | ICD-10-CM

## 2020-07-16 NOTE — Therapy (Addendum)
[facility] [place] [address] [place], [place], [address] Phone: [phone]   Fax:  [phone]  Physical Therapy Treatment  Patient Details  Name: Renee Mcgrath MRN: [id number] Date of Birth: 1971-07-14 Referring Provider (PT): Dr Clare Gandy   Encounter Date: 07/16/2020   PT End of Session - 07/16/20 1532    Visit Number 6    Number of Visits 12    Date for PT Re-Evaluation 08/11/20    PT Start Time 1525    PT Stop Time 1610    PT Time Calculation (min) 45 min    Activity Tolerance Patient tolerated treatment well    Behavior During Therapy [facility] for tasks assessed/performed           Past Medical History:  Diagnosis Date  . Hypertension     History reviewed. No pertinent surgical history.  There were no vitals filed for this visit.   Subjective Assessment - 07/16/20 1529    Subjective Pt reports her upper-midback pain has been worse this week.  RLE pain is only occasional now.  Pt reporting  overall 25% improvement in symptoms since starting therapy.    Pertinent History denies any medical or musculoskeletal problems    Patient Stated Goals get rid of the pain and return to PLOF    Currently in Pain? Yes    Pain Score 4     Pain Location Back    Pain Orientation Mid;Upper    Pain Descriptors / Indicators Aching    Aggravating Factors  prolonged work at [employer].    Pain Relieving Factors ?              [facility] PT Assessment - 07/16/20 0001      Assessment   Medical Diagnosis LBP with Rt LE pain; cervicalgia     Referring Provider (PT) Dr Clare Gandy    Onset Date/Surgical Date 05/29/20    Hand Dominance Right    Next MD Visit 07/17/20    Prior Therapy none       AROM   Cervical Flexion 60    Cervical Extension 60    Cervical - Right Side Bend 42    Cervical - Left Side Bend 38    Cervical - Right Rotation 56    Cervical - Left Rotation 65    Lumbar Flexion 60% with pain in midback.     Lumbar Extension 50%     Lumbar - Right Side Bend 75% with pain on Rt ribs/low back    Lumbar - Left Side Bend WNL    Lumbar - Right Rotation 50%     Lumbar - Left Rotation 50%          FOTO score:  51% limitation;    (63% at intake,  39% goal)   [facility] Adult PT Treatment/Exercise - 07/16/20 0001      Lumbar Exercises: Stretches   Other Lumbar Stretch Exercise childs pose x 10 sec x 2 reps      Lumbar Exercises: Aerobic   Nustep L5 x 5.5 min UE 10       Lumbar Exercises: Sidelying   Other Sidelying Lumbar Exercises open book x 4 reps each side.       Lumbar Exercises: Prone   Other Prone Lumbar Exercises scap squeeze with shoulder ext and axial ext x 5 reps, W's without axial ext x 5 reps, Airplane arms x 5 sec x 5 reps       Lumbar  Exercises: Quadruped   Madcat/Old Horse 10 reps      Modalities   Modalities --   pt declined; will use at home.      Manual Therapy   Manual therapy comments pt prone    Soft tissue mobilization STM/ MFR through the upper thoracic to lumbar erector spinae musculature; medial scapular musculature, Rt external obliques, lat - to decrease fascial restrictions and improve mobility.            Verbally and visually reviewed HEP, and sorted through strengthening vs daily stretches. Pt verbalized understanding.       PT Long Term Goals - 07/16/20 1533      PT LONG TERM GOAL #1   Title Improve cervical and lumbar ROM to 60-75% in all ranges with minimal to no pain    Time 6    Period Weeks    Status On-going      PT LONG TERM GOAL #2   Title Decrease pain to no more than 2/10 pain on the 0-10 pain scape with functional activities at work and home    Time 6    Period Weeks    Status On-going      PT LONG TERM GOAL #3   Title Patient to report ability to sit or stand for 30-60 min with minimal to no increase pain    Time 6    Period Weeks    Status On-going      PT LONG TERM GOAL #4   Title Independent in HEP    Time 6    Period  Weeks    Status On-going      PT LONG TERM GOAL #5   Title Improve FOTO to </= 39% limitation    Time 6    Period Weeks    Status On-going                 Plan - 07/16/20 1541    Clinical Impression Statement Lumbar ROM slightly improved; Cervical ROM improved in all planes except rotation.  Pt tolerated exercises well, however limited change in pain afterwards.  Pt point tender throughout erectors of upper thoracic through lumbar; limited tolerance to pressure into low back. Pt declined modalities as she plans to use heat/ TENS at home. Pt has made gradual progress towards goals and requests to hold therapy until MD appt.    Comorbidities cervical and shoulder pain; LBP with radiculopathy; obesity    Rehab Potential Good    PT Frequency 2x / week    PT Duration 6 weeks    PT Treatment/Interventions ADLs/Self Care Home Management;Aquatic Therapy;Cryotherapy;Electrical Stimulation;Iontophoresis 4mg /ml Dexamethasone;Moist Heat;Ultrasound;Gait training;Stair training;Functional mobility training;Therapeutic activities;Therapeutic exercise;Balance training;Neuromuscular re-education;Patient/family education;Manual techniques;Passive range of motion;Taping    PT Next Visit Plan progress with core strengthening and stabilization; modalities as indicated; possible trial of DN    PT Home Exercise Plan [email or web address]    Consulted and Agree with Plan of Care Patient           Patient will benefit from skilled therapeutic intervention in order to improve the following deficits and impairments:  Decreased range of motion, Increased muscle spasms, Obesity, Decreased activity tolerance, Pain, Hypomobility, Impaired flexibility, Improper body mechanics, Decreased mobility, Decreased strength, Impaired sensation, Postural dysfunction  Visit Diagnosis: Acute bilateral low back pain with right-sided sciatica  Cervicalgia  Other symptoms and signs involving the musculoskeletal system  Abnormal  posture     Problem List Patient Active Problem List   Diagnosis Date Noted  .  Acute bilateral low back pain with sciatica 06/17/2020  . Neck pain 06/17/2020  . Mixed hyperlipidemia 09/21/2017  . Benign essential hypertension 03/11/2014  . Seasonal allergic rhinitis 03/11/2014  . Plantar fasciitis of left foot 03/30/2013    Salvadore Oxford 07/16/2020, 4:32 PM  [facility] [address] [place], [place], [address] Phone: [phone]   Fax:  [phone]  Name: Renee Mcgrath MRN: [id number] Date of Birth: April 14, 1971

## 2020-07-17 ENCOUNTER — Encounter: Payer: Self-pay | Admitting: Family Medicine

## 2020-07-17 ENCOUNTER — Ambulatory Visit (INDEPENDENT_AMBULATORY_CARE_PROVIDER_SITE_OTHER): Payer: BC Managed Care – PPO | Admitting: Family Medicine

## 2020-07-17 DIAGNOSIS — M5441 Lumbago with sciatica, right side: Secondary | ICD-10-CM

## 2020-07-17 NOTE — Assessment & Plan Note (Signed)
Pain is more consistent in the middle back.  She does have improvement in the neck and the hips. -Counseled on home exercise therapy and supportive care. -Prescription for [employer] . -Work note. -Continue physical therapy.

## 2020-07-17 NOTE — Progress Notes (Signed)
Renee Mcgrath - 49 y.o. female MRN [id number]  Date of birth: [birth year]  SUBJECTIVE:  Including CC & ROS.  Chief Complaint  Patient presents with  . Follow-up    bilateral low back    Renee Mcgrath is a 49 y.o. female that is presenting with ongoing mid back pain.  She has had improvement of the neck pain as well as the hip pain.  She has been through about 3 weeks of physical therapy.  She does seem to have more back pain through the course of the day.  She is pretty active with her work.   Review of Systems See HPI   HISTORY: Past Medical, Surgical, Social, and Family History Reviewed & Updated per EMR.   Pertinent Historical Findings include:  Past Medical History:  Diagnosis Date  . Hypertension     No past surgical history on file.  Family History  Problem Relation Age of Onset  . Cancer Mother   . Multiple sclerosis Father   . Stroke Father     Social History   Socioeconomic History  . Marital status: Single    Spouse name: Not on file  . Number of children: Not on file  . Years of education: Not on file  . Highest education level: Not on file  Occupational History  . Not on file  Tobacco Use  . Smoking status: Never Smoker  . Smokeless tobacco: Never Used  Vaping Use  . Vaping Use: Never used  Substance and Sexual Activity  . Alcohol use: Not Currently  . Drug use: Never  . Sexual activity: Not on file  Other Topics Concern  . Not on file  Social History Narrative  . Not on file   Social Determinants of Health   Financial Resource Strain:   . Difficulty of Paying Living Expenses: Not on file  Food Insecurity:   . Worried About Programme researcher, broadcasting/film/video in the Last Year: Not on file  . Ran Out of Food in the Last Year: Not on file  Transportation Needs:   . Lack of Transportation (Medical): Not on file  . Lack of Transportation (Non-Medical): Not on file  Physical Activity:   . Days of Exercise per Week: Not on file  . Minutes of Exercise per  Session: Not on file  Stress:   . Feeling of Stress : Not on file  Social Connections:   . Frequency of Communication with Friends and Family: Not on file  . Frequency of Social Gatherings with Friends and Family: Not on file  . Attends Religious Services: Not on file  . Active Member of Clubs or Organizations: Not on file  . Attends Banker Meetings: Not on file  . Marital Status: Not on file  Intimate Partner Violence:   . Fear of Current or Ex-Partner: Not on file  . Emotionally Abused: Not on file  . Physically Abused: Not on file  . Sexually Abused: Not on file     PHYSICAL EXAM:  VS: BP 117/76   Pulse 92   Ht 5\' 7"  (1.702 m)   Wt 285 lb (129.3 kg)   BMI 44.64 kg/m  Physical Exam Gen: NAD, alert, cooperative with exam, well-appearing     ASSESSMENT & PLAN:   Acute bilateral low back pain with sciatica Pain is more consistent in the middle back.  She does have improvement in the neck and the hips. -Counseled on home exercise therapy and supportive care. -Prescription for . -  Work note. -Continue physical therapy.

## 2020-07-17 NOTE — Patient Instructions (Signed)
Good to see you Please try heat  Please continue physical therapy  Please try the standing desk  Please send me a message in MyChart with any questions or updates.  Please see me back in 4 weeks.   --Dr. Jordan Likes

## 2020-07-22 ENCOUNTER — Telehealth: Payer: Self-pay | Admitting: Family Medicine

## 2020-07-22 DIAGNOSIS — M5441 Lumbago with sciatica, right side: Secondary | ICD-10-CM

## 2020-07-22 NOTE — Telephone Encounter (Signed)
Patient called states her order for Physical therapy has ended & she states is not completely back to normal,pt request addt'l treatment & wishes PTto be extended.  --Forwarding request to provider for review of PT progress notes & decide if addt'l therapy needed--if so to send amended order to PT provider.  --glh

## 2020-07-23 NOTE — Telephone Encounter (Signed)
Placed referral to PT.   Myra Rude, MD [facility] 07/23/2020, 8:16 AM

## 2020-07-29 ENCOUNTER — Encounter: Payer: BC Managed Care – PPO | Admitting: Rehabilitative and Restorative Service Providers"

## 2020-08-04 ENCOUNTER — Other Ambulatory Visit: Payer: Self-pay

## 2020-08-04 ENCOUNTER — Ambulatory Visit (INDEPENDENT_AMBULATORY_CARE_PROVIDER_SITE_OTHER): Payer: BC Managed Care – PPO | Admitting: Physical Therapy

## 2020-08-04 ENCOUNTER — Encounter: Payer: Self-pay | Admitting: Physical Therapy

## 2020-08-04 DIAGNOSIS — M542 Cervicalgia: Secondary | ICD-10-CM

## 2020-08-04 DIAGNOSIS — M5441 Lumbago with sciatica, right side: Secondary | ICD-10-CM | POA: Diagnosis not present

## 2020-08-04 DIAGNOSIS — R293 Abnormal posture: Secondary | ICD-10-CM | POA: Diagnosis not present

## 2020-08-04 DIAGNOSIS — R29898 Other symptoms and signs involving the musculoskeletal system: Secondary | ICD-10-CM | POA: Diagnosis not present

## 2020-08-04 NOTE — Therapy (Signed)
[place], [place], [address] Phone: [phone]   Fax:  [phone]  Physical Therapy Treatment  Patient Details  Name: Renee Mcgrath MRN: [id number] Date of Birth: 1970/10/22 Referring Provider (PT): Dr Clearance Coots   Encounter Date: 08/04/2020   PT End of Session - 08/04/20 0848    Visit Number 7    Number of Visits 20    Date for PT Re-Evaluation 09/22/20    PT Start Time 0848    PT Stop Time 0932    PT Time Calculation (min) 44 min    Activity Tolerance Patient tolerated treatment well    Behavior During Therapy [facility] for tasks assessed/performed           Past Medical History:  Diagnosis Date  . Hypertension     History reviewed. No pertinent surgical history.  There were no vitals filed for this visit.   Subjective Assessment - 08/04/20 0848    Subjective Patient reporting some pain in the right hip. Last night it was 4/10. She did the TENS and heat and this morning it is down to 1/10. I don't know what I do to make it worse. MId back feels 2/10 today. She still has some days that it is worse.    Pertinent History denies any medical or musculoskeletal problems    Patient Stated Goals get rid of the pain and return to PLOF    Currently in Pain? Yes    Pain Score 1     Pain Location Hip    Pain Orientation Right    Pain Descriptors / Indicators Aching    Pain Type Acute pain              [facility] PT Assessment - 08/04/20 0001      AROM   Overall AROM Comments Cervical ROM WFL    Lumbar Flexion WFL but painful in midback in midrange flexion    Lumbar Extension full    Lumbar - Right Side Bend full    Lumbar - Left Side Bend full    Lumbar - Right Rotation 90%    Lumbar - Left Rotation 90%                         [facility] Adult PT Treatment/Exercise - 08/04/20 0001      Lumbar Exercises: Stretches   Other Lumbar Stretch Exercise warrior with left SB 2x30  sec       Lumbar Exercises: Aerobic   Nustep L5 x 5.5 min UE 10    progress reviewed     Lumbar Exercises: Standing   Other Standing Lumbar Exercises mini squat with shoulder extension and chest up x 10 sec to OH shoulder flexion x 10sec; 3 sets    Other Standing Lumbar Exercises Good mornings x 10       Lumbar Exercises: Seated   Sit to Stand 5 reps      Lumbar Exercises: Sidelying   Other Sidelying Lumbar Exercises open book x 5 reps each side 10 sec hold      Lumbar Exercises: Prone   Straight Leg Raise 5 reps    Straight Leg Raises Limitations 3 sec hold with pelvic press    Other Prone Lumbar Exercises scap squeeze with shoulder ext and axial ext x 5 reps    Other Prone Lumbar Exercises pelvic press x 5 sec      Lumbar Exercises: Quadruped  Madcat/Old Horse --   3 reps   Straight Leg Raise 5 reps   bil   Opposite Arm/Leg Raise Right arm/Left leg;Left arm/Right leg;10 reps    Other Quadruped Lumbar Exercises thread the needle to left x 1 10 sec                       PT Long Term Goals - 08/04/20 0854      PT LONG TERM GOAL #1   Title Improve cervical and lumbar ROM to 60-75% in all ranges with minimal to no pain    Baseline Full neck ROM; Lumbar WNL but has painful arc in range with flexion.    Status Partially Met      PT LONG TERM GOAL #2   Title Decrease pain to no more than 2/10 pain on the 0-10 pain scape with functional activities at work and home    Status On-going      PT [place] #3   Title Patient to report ability to sit or stand for 30-60 min with minimal to no increase pain    Baseline hasn't been sitting much at work. 30 min max without difficulty in the right chair    Status Partially Met      PT LONG TERM GOAL #4   Title Independent in HEP    Status Partially Met      PT LONG TERM GOAL #5   Title Improve FOTO to </= 39% limitation    Baseline 51% limited 07/16/20    Status On-going                 Plan - 08/04/20  1138    Clinical Impression Statement Patient presents today after 2 week break from PT. She denies pain in the neck and shoulders but continues to have right low back and hip pain as well as intermittent mid back pain. She is still very tight in the the right lumbar musculature and would benefit from a trial of DN here. She tolerated lumbar stabilization and functional strengthening without increased symptoms. She still has limitations with sitting depending on the chair she's in, but tolerance is increasing. She is still limited with lifting and unable to perform all her work functions without assistance. She will benefit from PT to address these deficits. MD sent new order for pt to continue PT.    Personal Factors and Comorbidities Comorbidity 1;Comorbidity 2    Comorbidities cervical and shoulder pain; LBP with radiculopathy; obesity    Examination-Activity Limitations Lift;Stand;Squat;Sit;Sleep;Reach Overhead    Examination-Participation Restrictions Cleaning;Laundry;Shop    PT Frequency 2x / week    PT Duration 6 weeks    PT Treatment/Interventions ADLs/Self Care Home Management;Aquatic Therapy;Cryotherapy;Electrical Stimulation;Iontophoresis 20m/ml Dexamethasone;Moist Heat;Ultrasound;Gait training;Stair training;Functional mobility training;Therapeutic activities;Therapeutic exercise;Balance training;Neuromuscular re-education;Patient/family education;Manual techniques;Passive range of motion;Taping    PT Next Visit Plan progress with core strengthening and stabilization; modalities as indicated; possible trial of DN    PT Home Exercise Plan 23VARELC           Patient will benefit from skilled therapeutic intervention in order to improve the following deficits and impairments:  Decreased range of motion, Increased muscle spasms, Obesity, Decreased activity tolerance, Pain, Hypomobility, Impaired flexibility, Improper body mechanics, Decreased mobility, Decreased strength, Impaired sensation,  Postural dysfunction  Visit Diagnosis: Acute bilateral low back pain with right-sided sciatica - Plan: PT plan of care cert/re-cert  Cervicalgia - Plan: PT plan of care cert/re-cert  Other symptoms  and signs involving the musculoskeletal system - Plan: PT plan of care cert/re-cert  Abnormal posture - Plan: PT plan of care cert/re-cert     Problem List Patient Active Problem List   Diagnosis Date Noted  . Acute bilateral low back pain with sciatica 06/17/2020  . Neck pain 06/17/2020  . Mixed hyperlipidemia 09/21/2017  . Benign essential hypertension 03/11/2014  . Seasonal allergic rhinitis 03/11/2014  . Plantar fasciitis of left foot 03/30/2013    Madelyn Flavors PT 08/04/2020, 12:59 PM  [facility] [place], [place], [address] Phone: [phone]   Fax:  [phone]  Name: Renee Mcgrath MRN: [id number] Date of Birth: [birth year]

## 2020-08-11 ENCOUNTER — Ambulatory Visit (INDEPENDENT_AMBULATORY_CARE_PROVIDER_SITE_OTHER): Payer: BC Managed Care – PPO | Admitting: Rehabilitative and Restorative Service Providers"

## 2020-08-11 ENCOUNTER — Other Ambulatory Visit: Payer: Self-pay

## 2020-08-11 DIAGNOSIS — R293 Abnormal posture: Secondary | ICD-10-CM

## 2020-08-11 DIAGNOSIS — M542 Cervicalgia: Secondary | ICD-10-CM

## 2020-08-11 DIAGNOSIS — M5441 Lumbago with sciatica, right side: Secondary | ICD-10-CM

## 2020-08-11 DIAGNOSIS — R29898 Other symptoms and signs involving the musculoskeletal system: Secondary | ICD-10-CM | POA: Diagnosis not present
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
## 2020-08-13 ENCOUNTER — Other Ambulatory Visit: Payer: Self-pay

## 2020-08-13 ENCOUNTER — Encounter: Payer: Self-pay | Admitting: Physical Therapy

## 2020-08-13 ENCOUNTER — Ambulatory Visit: Payer: BC Managed Care – PPO | Admitting: Physical Therapy

## 2020-08-13 DIAGNOSIS — R29898 Other symptoms and signs involving the musculoskeletal system: Secondary | ICD-10-CM

## 2020-08-13 DIAGNOSIS — M5441 Lumbago with sciatica, right side: Secondary | ICD-10-CM | POA: Diagnosis not present

## 2020-08-13 DIAGNOSIS — M542 Cervicalgia: Secondary | ICD-10-CM | POA: Diagnosis not present

## 2020-08-13 DIAGNOSIS — R293 Abnormal posture: Secondary | ICD-10-CM | POA: Diagnosis not present

## 2020-08-13 NOTE — Therapy (Signed)
Renee Mcgrath [place], [place], [address] Phone: [phone]   Fax:  [phone]  Physical Therapy Treatment  Patient Details  Name: Renee Mcgrath MRN: [id number] Date of Birth: 03-30-1971 Referring Provider (PT): Dr Clearance Coots   Encounter Date: 08/13/2020   PT End of Session - 08/13/20 1437    Visit Number 9    Number of Visits 20    Date for PT Re-Evaluation 09/22/20    PT Start Time 1440    PT Stop Time 1530    PT Time Calculation (min) 50 min    Activity Tolerance Patient tolerated treatment well    Behavior During Therapy [facility] for tasks assessed/performed           Past Medical History:  Diagnosis Date  . Hypertension     History reviewed. No pertinent surgical history.  There were no vitals filed for this visit.   Subjective Assessment - 08/13/20 1439    Subjective Patient reporting no pain today. She sat for 45 min without pain. The upper back is better, just tender.    Pertinent History denies any medical or musculoskeletal problems    Patient Stated Goals get rid of the pain and return to PLOF    Currently in Pain? No/denies                             [facility] Adult PT Treatment/Exercise - 08/13/20 0001      Exercises   Exercises Shoulder      Lumbar Exercises: Stretches   Lower Trunk Rotation 2 reps    Standing Side Bend 2 reps;20 seconds;Left    Standing Side Bend Limitations right SB causes pain on Rt      Lumbar Exercises: Aerobic   Nustep L5 x 6 min UE 10    progress reviewed     Lumbar Exercises: Supine   AB Set Limitations reviewed 3 reps with tactile cues    Bent Knee Raise 5 reps   then 5 reps each with alt leg lifts; up/up/down/down   Bent Knee Raise Limitations bil    Dead Bug 5 reps    Dead Bug Limitations this caused some pain and she had some instability today so we backed up to alt leg lefts      Lumbar Exercises: Sidelying   Clam 10  reps;Both    Other Sidelying Lumbar Exercises reverse clam x 10 reps bil    Other Sidelying Lumbar Exercises open book x 5 ea      Knee/Hip Exercises: Prone   Other Prone Exercises hip IR/ER bil x 10    Other Prone Exercises -      Shoulder Exercises: Prone   Other Prone Exercises Prone scap retraction x 10, with T's, with Ws x 5 reps each                  PT Education - 08/13/20 1730    Education Details Reviewed HEP; removed outdated TE and progressed where needed.    Person(s) Educated Patient    Methods Explanation;Demonstration;Handout;Tactile cues;Verbal cues    Comprehension Verbalized understanding;Returned demonstration               PT Long Term Goals - 08/13/20 1529      PT LONG TERM GOAL #1   Title Improve cervical and lumbar ROM to 60-75% in all ranges with minimal to no pain  Baseline Full neck ROM; Lumbar WNL and no pain with flexion today    Status Achieved      PT LONG TERM GOAL #2   Title Decrease pain to no more than 2/10 pain on the 0-10 pain scape with functional activities at work and home    Baseline morning is the worst pain and then pain at EOD with standing over sink    Status On-going      PT LONG TERM GOAL #3   Title Patient to report ability to sit or stand for 30-60 min with minimal to no increase pain    Baseline 45 min sitting today without pain; standing no limit    Status Partially Met      PT LONG TERM GOAL #4   Title Independent in HEP    Status Partially Met      PT LONG TERM GOAL #5   Title Improve FOTO to </= 39% limitation    Baseline 51% limited 07/16/20    Status On-going                 Plan - 08/13/20 1732    Clinical Impression Statement Patient presents today without pain in the low back  or mid back. She is progressing with LTGs for low back and has met her cervical goals. She has full ROM but still demonstrates some core weakness with strengthening. She also continues to have pain in the morning  (which she attributes somewhat to her mattress) and pain with slight flexion at the sink at EOD. Patient is modifying how she does her ADLS to decrease or eliminate pain for example squatting at the sink. We worked on consolidating her HEP today as it was getting overwhelming. Overall patient is improving but will benefit from further functional strengthening to protect from further injury.    Personal Factors and Comorbidities Comorbidity 1;Comorbidity 2    Comorbidities cervical and shoulder pain; LBP with radiculopathy; obesity    Examination-Activity Limitations Lift;Stand;Squat;Sit;Sleep;Reach Overhead    PT Frequency 2x / week    PT Duration 6 weeks    PT Treatment/Interventions ADLs/Self Care Home Management;Aquatic Therapy;Cryotherapy;Electrical Stimulation;Iontophoresis 26m/ml Dexamethasone;Moist Heat;Ultrasound;Gait training;Stair training;Functional mobility training;Therapeutic activities;Therapeutic exercise;Balance training;Neuromuscular re-education;Patient/family education;Manual techniques;Passive range of motion;Taping    PT Next Visit Plan FOTO; Provide handout of updated HEP. Add functional squats. DN and STM as needed in gluteals and paraspinals.    PT Home Exercise Plan 23VARELC    Consulted and Agree with Plan of Care Patient           Patient will benefit from skilled therapeutic intervention in order to improve the following deficits and impairments:  Decreased range of motion, Increased muscle spasms, Obesity, Decreased activity tolerance, Pain, Hypomobility, Impaired flexibility, Improper body mechanics, Decreased mobility, Decreased strength, Impaired sensation, Postural dysfunction  Visit Diagnosis: Acute bilateral low back pain with right-sided sciatica  Cervicalgia  Other symptoms and signs involving the musculoskeletal system  Abnormal posture     Problem List Patient Active Problem List   Diagnosis Date Noted  . Acute bilateral low back pain with  sciatica 06/17/2020  . Neck pain 06/17/2020  . Mixed hyperlipidemia 09/21/2017  . Benign essential hypertension 03/11/2014  . Seasonal allergic rhinitis 03/11/2014  . Plantar fasciitis of left foot 03/30/2013   JMadelyn FlavorsPT 08/13/2020, 5:46 PM  [name] [place] [address]Phone: [phone]  Fax:  [phone] Name: SRENDA POHLMANMRN: [id number]  Date of Birth: 13-Jul-1971

## 2020-08-13 NOTE — Patient Instructions (Signed)
HEP updated but Med[employer] down so couldn't print today.

## 2020-08-14 ENCOUNTER — Ambulatory Visit: Payer: BC Managed Care – PPO | Admitting: Family Medicine

## 2020-08-14 NOTE — Progress Notes (Deleted)
  Renee Mcgrath - 49 y.o. female MRN [id number]  Date of birth: May 29, 1971  SUBJECTIVE:  Including CC & ROS.  No chief complaint on file.   Renee Mcgrath is a 49 y.o. female that is  ***.  ***   Review of Systems See HPI   HISTORY: Past Medical, Surgical, Social, and Family History Reviewed & Updated per EMR.   Pertinent Historical Findings include:  Past Medical History:  Diagnosis Date  . Hypertension     No past surgical history on file.  Family History  Problem Relation Age of Onset  . Cancer Mother   . Multiple sclerosis Father   . Stroke Father     Social History   Socioeconomic History  . Marital status: Single    Spouse name: Not on file  . Number of children: Not on file  . Years of education: Not on file  . Highest education level: Not on file  Occupational History  . Not on file  Tobacco Use  . Smoking status: Never Smoker  . Smokeless tobacco: Never Used  Vaping Use  . Vaping Use: Never used  Substance and Sexual Activity  . Alcohol use: Not Currently  . Drug use: Never  . Sexual activity: Not on file  Other Topics Concern  . Not on file  Social History Narrative  . Not on file   Social Determinants of Health   Financial Resource Strain:   . Difficulty of Paying Living Expenses: Not on file  Food Insecurity:   . Worried About Programme researcher, broadcasting/film/video in the Last Year: Not on file  . Ran Out of Food in the Last Year: Not on file  Transportation Needs:   . Lack of Transportation (Medical): Not on file  . Lack of Transportation (Non-Medical): Not on file  Physical Activity:   . Days of Exercise per Week: Not on file  . Minutes of Exercise per Session: Not on file  Stress:   . Feeling of Stress : Not on file  Social Connections:   . Frequency of Communication with Friends and Family: Not on file  . Frequency of Social Gatherings with Friends and Family: Not on file  . Attends Religious Services: Not on file  . Active Member of Clubs or  Organizations: Not on file  . Attends Banker Meetings: Not on file  . Marital Status: Not on file  Intimate Partner Violence:   . Fear of Current or Ex-Partner: Not on file  . Emotionally Abused: Not on file  . Physically Abused: Not on file  . Sexually Abused: Not on file     PHYSICAL EXAM:  VS: There were no vitals taken for this visit. Physical Exam Gen: NAD, alert, cooperative with exam, well-appearing MSK:  ***      ASSESSMENT & PLAN:   No problem-specific Assessment & Plan notes found for this encounter.

## 2020-08-18 ENCOUNTER — Encounter: Payer: Self-pay | Admitting: Rehabilitative and Restorative Service Providers"

## 2020-08-18 ENCOUNTER — Other Ambulatory Visit: Payer: Self-pay

## 2020-08-18 ENCOUNTER — Ambulatory Visit: Payer: BC Managed Care – PPO | Admitting: Rehabilitative and Restorative Service Providers"

## 2020-08-18 DIAGNOSIS — R29898 Other symptoms and signs involving the musculoskeletal system: Secondary | ICD-10-CM | POA: Diagnosis not present

## 2020-08-18 DIAGNOSIS — R293 Abnormal posture: Secondary | ICD-10-CM | POA: Diagnosis not present

## 2020-08-18 DIAGNOSIS — M5441 Lumbago with sciatica, right side: Secondary | ICD-10-CM | POA: Diagnosis not present

## 2020-08-18 DIAGNOSIS — M542 Cervicalgia: Secondary | ICD-10-CM

## 2020-08-18 NOTE — Therapy (Signed)
[place], [place], [address] Phone: [phone]   Fax:  [phone]  Physical Therapy Treatment  Patient Details  Name: Renee Mcgrath MRN: [id number] Date of Birth: [birth year] Referring Provider (PT): Dr Clearance Coots   Encounter Date: 08/18/2020   PT End of Session - 08/18/20 1611    Visit Number 10    Number of Visits 20    Date for PT Re-Evaluation 09/22/20    PT Start Time 4920    PT Stop Time 1655    PT Time Calculation (min) 45 min    Activity Tolerance Patient tolerated treatment well           Past Medical History:  Diagnosis Date  . Hypertension     History reviewed. No pertinent surgical history.  There were no vitals filed for this visit.   Subjective Assessment - 08/18/20 1613    Subjective Continues to improve. Had some pain when getting out of the car to come in to PT. See Dr Raeford Razor tomorrow afternoon. Had to reschedule appointment due to traffic hold up. Can tell she is able to sit for longer periods of time. Still tight when she awakens in the morning.    Currently in Pain? Yes    Pain Score 1     Pain Location Flank    Pain Orientation Right    Pain Descriptors / Indicators Dull    Pain Type Acute pain    Pain Onset More than a month ago    Pain Frequency Intermittent    Aggravating Factors  prolonged work at computer              [facility] PT Assessment - 08/18/20 0001      Assessment   Medical Diagnosis LBP with Rt LE pain; cervicalgia     Referring Provider (PT) Dr Clearance Coots    Onset Date/Surgical Date 05/29/20    Hand Dominance Right    Next MD Visit 08/19/20    Prior Therapy none       Observation/Other Assessments   Focus on Therapeutic Outcomes (FOTO)  43% limitation       AROM   Cervical Flexion 60    Cervical Extension 60    Cervical - Right Side Bend 42    Cervical - Left Side Bend 38    Cervical - Right Rotation 56    Cervical -  Left Rotation 65    Lumbar Flexion WFL's     Lumbar Extension full    Lumbar - Right Side Bend full    Lumbar - Left Side Bend full    Lumbar - Right Rotation 90%    Lumbar - Left Rotation 90%      Palpation   Palpation comment improving muscular tightness Rt > Lt ant/lat/posterior cervical spine; upper trap; pecs; leveator; lumbar paraspinals; QL; posterior hip; anterior hip                          [facility] Adult PT Treatment/Exercise - 08/18/20 0001      Lumbar Exercises: Stretches   Standing Side Bend 2 reps;20 seconds;Left    Other Lumbar Stretch Exercise warrior with left SB 4 x 30 sec       Lumbar Exercises: Aerobic   Nustep L5 x 6 min UE 10    progress reviewed     Lumbar Exercises: Standing   Functional Squats 10 reps;3 seconds  Lumbar Exercises: Seated   Sit to Stand 10 reps   holding 5# KB at chest    Other Seated Lumbar Exercises lifting 5# KB from one side across chest to place on opposite side x 5 x 2 sets with lateral trunk flexion to Lt for stretch 20 sec x 1 rep btn sets and at the end of ex       Lumbar Exercises: Supine   Bent Knee Raise 10 reps;2 seconds    Bridge 10 reps;5 seconds    Other Supine Lumbar Exercises alternate hip abduction blue TB x 10 each LE       Lumbar Exercises: Sidelying   Other Sidelying Lumbar Exercises reverse clam x 10 reps bil    Other Sidelying Lumbar Exercises open book x 5 ea      Shoulder Exercises: Stretch   Other Shoulder Stretches doorway stretch 3 positons 30 sec x 3 reps each position                   PT Education - 08/18/20 1630    Education Details HEP    Person(s) Educated Patient    Methods Explanation;Demonstration;Tactile cues;Verbal cues;Handout    Comprehension Verbalized understanding;Returned demonstration;Verbal cues required;Tactile cues required;Need further instruction               PT Long Term Goals - 08/13/20 1529      PT LONG TERM GOAL #1   Title Improve cervical  and lumbar ROM to 60-75% in all ranges with minimal to no pain    Baseline Full neck ROM; Lumbar WNL and no pain with flexion today    Status Achieved      PT LONG TERM GOAL #2   Title Decrease pain to no more than 2/10 pain on the 0-10 pain scape with functional activities at work and home    Baseline morning is the worst pain and then pain at EOD with standing over sink    Status On-going      PT LONG TERM GOAL #3   Title Patient to report ability to sit or stand for 30-60 min with minimal to no increase pain    Baseline 45 min sitting today without pain; standing no limit    Status Partially Met      PT LONG TERM GOAL #4   Title Independent in HEP    Status Partially Met      PT LONG TERM GOAL #5   Title Improve FOTO to </= 39% limitation    Baseline 51% limited 07/16/20    Status On-going                 Plan - 08/18/20 1620    Clinical Impression Statement Patient continued to report and demonstrate improvement in musculoskeletal pain and dysfunction. She is progressing toward goals for LBP and has met cervical goals. She has intermittent pain in the low back and morning stiffness. She will benefit from continued therapy to work on strengthening and stabilization to return to [facility] and avoid recurrent problems.    Rehab Potential Good    PT Frequency 2x / week    PT Duration 6 weeks    PT Treatment/Interventions ADLs/Self Care Home Management;Aquatic Therapy;Cryotherapy;Electrical Stimulation;Iontophoresis 72m/ml Dexamethasone;Moist Heat;Ultrasound;Gait training;Stair training;Functional mobility training;Therapeutic activities;Therapeutic exercise;Balance training;Neuromuscular re-education;Patient/family education;Manual techniques;Passive range of motion;Taping    PT Next Visit Plan FOTO; Provide handout of updated HEP. Add functional squats. DN and STM as needed in gluteals and paraspinals.  PT Home Exercise Plan [email or web address]    Consulted and Agree with Plan of Care  Patient           Patient will benefit from skilled therapeutic intervention in order to improve the following deficits and impairments:     Visit Diagnosis: Acute bilateral low back pain with right-sided sciatica  Cervicalgia  Other symptoms and signs involving the musculoskeletal system  Abnormal posture     Problem List Patient Active Problem List   Diagnosis Date Noted  . Acute bilateral low back pain with sciatica 06/17/2020  . Neck pain 06/17/2020  . Mixed hyperlipidemia 09/21/2017  . Benign essential hypertension 03/11/2014  . Seasonal allergic rhinitis 03/11/2014  . Plantar fasciitis of left foot 03/30/2013    Demetrias Goodbar Nilda Simmer PT, MPH  08/18/2020, 4:59 PM  [facility] [place], [place], [address] Phone: [phone]   Fax:  [phone]  Name: Renee Mcgrath MRN: [id number] Date of Birth: 22-Oct-1970

## 2020-08-18 NOTE — Patient Instructions (Signed)
Access Code: [email or web address]: [email or web address]: 11/08/2021Prepared by: Sennie Borden HoltExercises  Doorway Pec Stretch at 120 Degrees Abduction - 3 x daily - 7 x weekly - 3 reps - 1 sets - 30 second hold hold  Standing Quadratus Lumborum Stretch with Doorway - 1 x daily - 7 x weekly - 1 sets - 2 reps - 60 sec hold  Sidelying Thoracic Rotation with Open Book - 1 x daily - 7 x weekly - 2 sets - 5 reps  Supine Piriformis Stretch with Leg Straight - 1 x daily - 7 x weekly - 1 sets - 3 reps - 30 sec hold  Hooklying Hamstring Stretch with Strap - 1 x daily - 7 x weekly - 1 sets - 3 reps - 30 sec hold  Supine ITB Stretch with Strap - 1 x daily - 7 x weekly - 1 sets - 3 reps - 30 sec hold  Prone Quadriceps Stretch with Strap - 1 x daily - 7 x weekly - 1 sets - 3 reps - 30 sec hold  Clamshell - 1 x daily - 7 x weekly - 1-3 sets - 10 reps  Reverse Clamshell in Extension and Abduction - 1 x daily - 7 x weekly - 1-3 sets - 10 reps  Prone Scapular Retraction - 1 x daily - 7 x weekly - 1 sets - 5-10 reps - 3-5 sec hold  Prone Scapular Retraction Arms at Side - 1 x daily - 7 x weekly - 1 sets - 5-10 reps - 3-5 sec hold  Prone W Scapular Retraction - 1 x daily - 7 x weekly - 1 sets - 5-10 reps - 3-5 sec hold  Standing Shoulder External Rotation with Resistance - 2 x daily - 2 x weekly - 1-3 sets - 10 reps - 2-3 sec hold  Standing Bilateral Low Shoulder Row with Anchored Resistance - 2 x daily - 2 x weekly - 1-3 sets - 10 reps - 2-3 sec hold  Shoulder Extension with Resistance - 2 x daily - 2 x weekly - 1-3 sets - 10 reps - 2-3 sec hold  Supine March - 2 x daily - 7 x weekly - 1-2 sets - 10 reps - 2 sec hold  Mini Squat - 2 x daily - 7 x weekly - 1 sets - 10 reps - 2-5 sec hold

## 2020-08-19 ENCOUNTER — Ambulatory Visit (INDEPENDENT_AMBULATORY_CARE_PROVIDER_SITE_OTHER): Payer: BC Managed Care – PPO | Admitting: Family Medicine

## 2020-08-19 ENCOUNTER — Encounter: Payer: Self-pay | Admitting: Family Medicine

## 2020-08-19 ENCOUNTER — Other Ambulatory Visit: Payer: Self-pay

## 2020-08-19 VITALS — BP 138/84 | HR 98 | Ht 67.0 in | Wt 285.0 lb

## 2020-08-19 DIAGNOSIS — M5441 Lumbago with sciatica, right side: Secondary | ICD-10-CM

## 2020-08-19 NOTE — Progress Notes (Signed)
  Renee Mcgrath - 49 y.o. female MRN [id number]  Date of birth: 03/26/1971  SUBJECTIVE:  Including CC & ROS.  Chief Complaint  Patient presents with  . Follow-up    mid to low back    Renee Mcgrath is a 49 y.o. female that is following up for low back pain.  She has gotten significant improvement with physical therapy and restrictions.    Review of Systems See HPI   HISTORY: Past Medical, Surgical, Social, and Family History Reviewed & Updated per EMR.   Pertinent Historical Findings include:  Past Medical History:  Diagnosis Date  . Hypertension     No past surgical history on file.  Family History  Problem Relation Age of Onset  . Cancer Mother   . Multiple sclerosis Father   . Stroke Father     Social History   Socioeconomic History  . Marital status: Single    Spouse name: Not on file  . Number of children: Not on file  . Years of education: Not on file  . Highest education level: Not on file  Occupational History  . Not on file  Tobacco Use  . Smoking status: Never Smoker  . Smokeless tobacco: Never Used  Vaping Use  . Vaping Use: Never used  Substance and Sexual Activity  . Alcohol use: Not Currently  . Drug use: Never  . Sexual activity: Not on file  Other Topics Concern  . Not on file  Social History Narrative  . Not on file   Social Determinants of Health   Financial Resource Strain:   . Difficulty of Paying Living Expenses: Not on file  Food Insecurity:   . Worried About Programme researcher, broadcasting/film/video in the Last Year: Not on file  . Ran Out of Food in the Last Year: Not on file  Transportation Needs:   . Lack of Transportation (Medical): Not on file  . Lack of Transportation (Non-Medical): Not on file  Physical Activity:   . Days of Exercise per Week: Not on file  . Minutes of Exercise per Session: Not on file  Stress:   . Feeling of Stress : Not on file  Social Connections:   . Frequency of Communication with Friends and Family: Not on file   . Frequency of Social Gatherings with Friends and Family: Not on file  . Attends Religious Services: Not on file  . Active Member of Clubs or Organizations: Not on file  . Attends Banker Meetings: Not on file  . Marital Status: Not on file  Intimate Partner Violence:   . Fear of Current or Ex-Partner: Not on file  . Emotionally Abused: Not on file  . Physically Abused: Not on file  . Sexually Abused: Not on file     PHYSICAL EXAM:  VS: BP 138/84   Pulse 98   Ht 5\' 7"  (1.702 m)   Wt 285 lb (129.3 kg)   BMI 44.64 kg/m  Physical Exam Gen: NAD, alert, cooperative with exam, well-appearing    ASSESSMENT & PLAN:   Acute bilateral low back pain with sciatica Has started showing improvement with the pain.  Has had good improvement with physical therapy. -Counseled on home exercise therapy and supportive care. -Referral to physical therapy. -Provided work note. -Follow-up in 4 weeks.

## 2020-08-19 NOTE — Patient Instructions (Signed)
Good to see you  Please continue the exercises  Please send me a message in MyChart with any questions or updates.  Please see me back in 4 weeks.   --Dr. Jordan Likes

## 2020-08-19 NOTE — Assessment & Plan Note (Signed)
Has started showing improvement with the pain.  Has had good improvement with physical therapy. -Counseled on home exercise therapy and supportive care. -Referral to physical therapy. -Provided work note. -Follow-up in 4 weeks.

## 2020-08-21 ENCOUNTER — Encounter: Payer: Self-pay | Admitting: Rehabilitative and Restorative Service Providers"

## 2020-08-21 ENCOUNTER — Other Ambulatory Visit: Payer: Self-pay

## 2020-08-21 ENCOUNTER — Ambulatory Visit (INDEPENDENT_AMBULATORY_CARE_PROVIDER_SITE_OTHER): Payer: BC Managed Care – PPO | Admitting: Rehabilitative and Restorative Service Providers"

## 2020-08-21 DIAGNOSIS — M5441 Lumbago with sciatica, right side: Secondary | ICD-10-CM | POA: Diagnosis not present

## 2020-08-21 DIAGNOSIS — R293 Abnormal posture: Secondary | ICD-10-CM | POA: Diagnosis not present

## 2020-08-21 DIAGNOSIS — M542 Cervicalgia: Secondary | ICD-10-CM

## 2020-08-21 DIAGNOSIS — R29898 Other symptoms and signs involving the musculoskeletal system: Secondary | ICD-10-CM | POA: Diagnosis not present

## 2020-08-21 NOTE — Therapy (Signed)
[place], [place], [address] Phone: [phone]   Fax:  [phone]  Physical Therapy Treatment  Patient Details  Name: Renee Mcgrath MRN: [id number] Date of Birth: Jun 19, 1971 Referring Provider (PT): Dr Clearance Coots   Encounter Date: 08/21/2020   PT End of Session - 08/21/20 0807    Visit Number 11    Number of Visits 20    Date for PT Re-Evaluation 09/22/20    PT Start Time 0803    PT Stop Time 0845    PT Time Calculation (min) 42 min    Activity Tolerance Patient tolerated treatment well           Past Medical History:  Diagnosis Date  . Hypertension     History reviewed. No pertinent surgical history.  There were no vitals filed for this visit.   Subjective Assessment - 08/21/20 0808    Subjective Doing OK. She saw MD yesterday and he wants her to continue therapy to further resolve symptoms. Working on exercises on a daily basis.    Currently in Pain? No/denies    Pain Score 0-No pain                             [facility] Adult PT Treatment/Exercise - 08/21/20 0001      Lumbar Exercises: Stretches   Other Lumbar Stretch Exercise warrior with left SB 3 x 30 sec       Lumbar Exercises: Aerobic   Tread Mill 2- 2.3 mph x 6 min       Lumbar Exercises: Standing   Functional Squats 10 reps;3 seconds    Functional Squats Limitations repeated 10 reps with 10# KB     Other Standing Lumbar Exercises single arm carry 10# KB x 2 laps of gym each hand VC to engage core       Lumbar Exercises: Seated   Sit to Stand 10 reps   holding 5# KB at chest      Lumbar Exercises: Supine   Bridge 10 reps;5 seconds    Other Supine Lumbar Exercises alternate hip abduction blue TB x 10 each LE       Lumbar Exercises: Sidelying   Other Sidelying Lumbar Exercises reverse clam x 10 reps bil    Other Sidelying Lumbar Exercises open book x 5 ea      Shoulder Exercises: Standing     Row Strengthening;Both;10 reps;Theraband    Theraband Level (Shoulder Row) Level 3 (Green)    Row Limitations bow and arrow 10 reps green TB each side     Other Standing Exercises antirotation green TB x 10 each side       Shoulder Exercises: Stretch   Other Shoulder Stretches doorway stretch 3 positons 30 sec x 3 reps each position                        PT Long Term Goals - 08/13/20 1529      PT LONG TERM GOAL #1   Title Improve cervical and lumbar ROM to 60-75% in all ranges with minimal to no pain    Baseline Full neck ROM; Lumbar WNL and no pain with flexion today    Status Achieved      PT LONG TERM GOAL #2   Title Decrease pain to no more than 2/10 pain on the 0-10 pain scape with functional activities at work  and home    Baseline morning is the worst pain and then pain at EOD with standing over sink    Status On-going      PT LONG TERM GOAL #3   Title Patient to report ability to sit or stand for 30-60 min with minimal to no increase pain    Baseline 45 min sitting today without pain; standing no limit    Status Partially Met      PT LONG TERM GOAL #4   Title Independent in HEP    Status Partially Met      PT LONG TERM GOAL #5   Title Improve FOTO to </= 39% limitation    Baseline 51% limited 07/16/20    Status On-going                 Plan - 08/21/20 0817    Clinical Impression Statement Continued improvement with decreased pain and periods with no pain. Working on core stabilization in clinic with varied exercises from her HEP. Provided updated HEP.    Rehab Potential Good    PT Frequency 2x / week    PT Duration 6 weeks    PT Treatment/Interventions ADLs/Self Care Home Management;Aquatic Therapy;Cryotherapy;Electrical Stimulation;Iontophoresis 25m/ml Dexamethasone;Moist Heat;Ultrasound;Gait training;Stair training;Functional mobility training;Therapeutic activities;Therapeutic exercise;Balance training;Neuromuscular  re-education;Patient/family education;Manual techniques;Passive range of motion;Taping    PT Next Visit Plan continue with DN and STM as needed in gluteals and paraspinals; progress with core stabilization and strengthening    PT Home Exercise Plan [email or web address]    Consulted and Agree with Plan of Care Patient           Patient will benefit from skilled therapeutic intervention in order to improve the following deficits and impairments:     Visit Diagnosis: Acute bilateral low back pain with right-sided sciatica  Cervicalgia  Other symptoms and signs involving the musculoskeletal system  Abnormal posture     Problem List Patient Active Problem List   Diagnosis Date Noted  . Acute bilateral low back pain with sciatica 06/17/2020  . Neck pain 06/17/2020  . Mixed hyperlipidemia 09/21/2017  . Benign essential hypertension 03/11/2014  . Seasonal allergic rhinitis 03/11/2014  . Plantar fasciitis of left foot 03/30/2013    Renee Mcgrath PNilda SimmerPT, MPH  08/21/2020, 8:42 AM  [facility] [address]Phone: [phone]  Fax:  [phone] Name: Renee SHIFFERMRN: [id number] of Birth: [birth year]

## 2020-08-27 ENCOUNTER — Encounter: Payer: Self-pay | Admitting: Rehabilitative and Restorative Service Providers"

## 2020-08-27 ENCOUNTER — Ambulatory Visit (INDEPENDENT_AMBULATORY_CARE_PROVIDER_SITE_OTHER): Payer: BC Managed Care – PPO | Admitting: Rehabilitative and Restorative Service Providers"

## 2020-08-27 ENCOUNTER — Other Ambulatory Visit: Payer: Self-pay

## 2020-08-27 DIAGNOSIS — M542 Cervicalgia: Secondary | ICD-10-CM

## 2020-08-27 DIAGNOSIS — R29898 Other symptoms and signs involving the musculoskeletal system: Secondary | ICD-10-CM

## 2020-08-27 DIAGNOSIS — M5441 Lumbago with sciatica, right side: Secondary | ICD-10-CM | POA: Diagnosis not present

## 2020-08-27 DIAGNOSIS — R293 Abnormal posture: Secondary | ICD-10-CM

## 2020-08-27 NOTE — Patient Instructions (Signed)
Access Code: [email or web address] URL: [email or web address] Date: 08/27/2020 Prepared by: Margretta Ditty  Exercises Doorway Pec Stretch at 120 Degrees Abduction - 3 x daily - 7 x weekly - 3 reps - 1 sets - 30 second hold hold Standing Quadratus Lumborum Stretch with Doorway - 1 x daily - 7 x weekly - 1 sets - 2 reps - 60 sec hold Seated Piriformis Stretch with Trunk Bend - 2 x daily - 7 x weekly - 1 sets - 3 reps - 30 second hold Seated Hamstring Stretch with Chair - 2 x daily - 7 x weekly - 1 sets - 3 reps - 30 seconds hold Mini Squat - 2 x daily - 7 x weekly - 1 sets - 10 reps - 2-5 sec hold Step Up - 2 x daily - 7 x weekly - 1 sets - 10 reps Lateral Step Up - 2 x daily - 7 x weekly - 1 sets - 10 reps Supine ITB Stretch with Strap - 1 x daily - 7 x weekly - 1 sets - 3 reps - 30 sec hold Sidelying Thoracic Rotation with Open Book - 1 x daily - 7 x weekly - 2 sets - 5 reps Prone W Scapular Retraction - 1 x daily - 7 x weekly - 1 sets - 5-10 reps - 3-5 sec hold Standing Shoulder External Rotation with Resistance - 2 x daily - 2 x weekly - 1-3 sets - 10 reps - 2-3 sec hold Standing Bilateral Low Shoulder Row with Anchored Resistance - 2 x daily - 2 x weekly - 1-3 sets - 10 reps - 2-3 sec hold

## 2020-08-27 NOTE — Therapy (Signed)
[place], [place], [address] Phone: [phone]   Fax:  [phone]  Physical Therapy Treatment  Patient Details  Name: Renee Mcgrath MRN: [id number] Date of Birth: 11-28-1970 Referring Provider (PT): Dr Clearance Coots   Encounter Date: 08/27/2020   PT End of Session - 08/27/20 0859    Visit Number 12    Number of Visits 20    Date for PT Re-Evaluation 09/22/20    PT Start Time 0852    PT Stop Time 0933    PT Time Calculation (min) 41 min    Activity Tolerance Patient tolerated treatment well           Past Medical History:  Diagnosis Date   Hypertension     History reviewed. No pertinent surgical history.  There were no vitals filed for this visit.   Subjective Assessment - 08/27/20 0858    Subjective The patient reports she is only noticing pain in the morning described as stiffness, then when she is on her feet for longer periods of time.  She feels 50% improvement.    Pertinent History denies any medical or musculoskeletal problems    Patient Stated Goals get rid of the pain and return to PLOF    Currently in Pain? No/denies              [facility] PT Assessment - 08/27/20 0901      Assessment   Medical Diagnosis LBP with Rt LE pain; cervicalgia     Referring Provider (PT) Dr Clearance Coots    Onset Date/Surgical Date 05/29/20    Hand Dominance Right                         [facility] Adult PT Treatment/Exercise - 08/27/20 0901      Exercises   Exercises Lumbar;Knee/Hip      Lumbar Exercises: Stretches   Active Hamstring Stretch Right;Left;1 rep;30 seconds    Single Knee to Chest Stretch Right;Left;2 reps;30 seconds    Lower Trunk Rotation 5 reps    Piriformis Stretch Right;Left;2 reps;30 seconds      Lumbar Exercises: Aerobic   Tread Mill 10mnutes at 2.1 mph      Lumbar Exercises: Standing   Other Standing Lumbar Exercises Standing marches with 5 lb  kettle bell overhead    Other Standing Lumbar Exercises [employer] with 5 and 10# kettle bells x 150 feet, x 2 reps      Lumbar Exercises: Sidelying   Other Sidelying Lumbar Exercises sidelying plank modified wiht knees bent on elbow x 5 reps      Lumbar Exercises: Quadruped   Madcat/Old Horse 5 reps    Straight Leg Raise 5 reps      Knee/Hip Exercises: Standing   Forward Lunges Right;Left;10 reps    Forward Lunges Limitations isometric holds with theraband diagonals    Lateral Step Up Right;Left;10 reps    Lateral Step Up Limitations 3" surface stepping up and over     Forward Step Up Right;Left;10 reps    Forward Step Up Limitations with yoga block overhead    Step Down Right;Left;10 reps    Step Down Limitations from 3" surface reaching with contralateral heel return to step and toe tap behind step                  PT Education - 08/27/20 0937    Education Details consolidated HEP  Person(s) Educated Patient    Methods Explanation;Demonstration;Handout    Comprehension Returned demonstration;Verbalized understanding               PT Long Term Goals - 08/13/20 1529      PT LONG TERM GOAL #1   Title Improve cervical and lumbar ROM to 60-75% in all ranges with minimal to no pain    Baseline Full neck ROM; Lumbar WNL and no pain with flexion today    Status Achieved      PT LONG TERM GOAL #2   Title Decrease pain to no more than 2/10 pain on the 0-10 pain scape with functional activities at work and home    Baseline morning is the worst pain and then pain at EOD with standing over sink    Status On-going      PT LONG TERM GOAL #3   Title Patient to report ability to sit or stand for 30-60 min with minimal to no increase pain    Baseline 45 min sitting today without pain; standing no limit    Status Partially Met      PT LONG TERM GOAL #4   Title Independent in HEP    Status Partially Met      PT LONG TERM GOAL #5   Title Improve FOTO to </= 39%  limitation    Baseline 51% limited 07/16/20    Status On-going                 Plan - 08/27/20 1154    Clinical Impression Statement PT continuing to progress core stabaility and LE strength to improve endurance t/o the day.  PT modified HEP to consolidate anf progress difficulty.  Continue working to [employer].    Rehab Potential Good    PT Frequency 2x / week    PT Duration 6 weeks    PT Treatment/Interventions ADLs/Self Care Home Management;Aquatic Therapy;Cryotherapy;Electrical Stimulation;Iontophoresis 51m/ml Dexamethasone;Moist Heat;Ultrasound;Gait training;Stair training;Functional mobility training;Therapeutic activities;Therapeutic exercise;Balance training;Neuromuscular re-education;Patient/family education;Manual techniques;Passive range of motion;Taping    PT Next Visit Plan continue with DN and STM as needed in gluteals and paraspinals; progress with core stabilization and strengthening    PT Home Exercise Plan [id number]    Consulted and Agree with Plan of Care Patient           Patient will benefit from skilled therapeutic intervention in order to improve the following deficits and impairments:     Visit Diagnosis: Acute bilateral low back pain with right-sided sciatica  Cervicalgia  Other symptoms and signs involving the musculoskeletal system  Abnormal posture     Problem List Patient Active Problem List   Diagnosis Date Noted   Acute bilateral low back pain with sciatica 06/17/2020   Neck pain 06/17/2020   Mixed hyperlipidemia 09/21/2017   Benign essential hypertension 03/11/2014   Seasonal allergic rhinitis 03/11/2014   Plantar fasciitis of left foot 03/30/2013    Mattalyn Anderegg, PT 08/27/2020, 11:57 AM  [facility] [address] [address]Phone: [phone]  Fax:  [phone] Name: Renee SULAMRN: [id number] of Birth: [birth year]

## 2020-08-29 ENCOUNTER — Other Ambulatory Visit: Payer: Self-pay

## 2020-08-29 ENCOUNTER — Encounter: Payer: Self-pay | Admitting: Rehabilitative and Restorative Service Providers"

## 2020-08-29 ENCOUNTER — Ambulatory Visit: Payer: BC Managed Care – PPO | Admitting: Rehabilitative and Restorative Service Providers"

## 2020-08-29 DIAGNOSIS — R293 Abnormal posture: Secondary | ICD-10-CM | POA: Diagnosis not present

## 2020-08-29 DIAGNOSIS — R29898 Other symptoms and signs involving the musculoskeletal system: Secondary | ICD-10-CM

## 2020-08-29 DIAGNOSIS — M542 Cervicalgia: Secondary | ICD-10-CM | POA: Diagnosis not present

## 2020-08-29 DIAGNOSIS — M5441 Lumbago with sciatica, right side: Secondary | ICD-10-CM | POA: Diagnosis not present

## 2020-08-29 NOTE — Therapy (Signed)
[place], [place], [address] Phone: [phone]   Fax:  [phone]  Physical Therapy Treatment  Patient Details  Name: Renee Mcgrath MRN: [id number] Date of Birth: 1971-05-21 Referring Provider (PT): Dr Clearance Coots   Encounter Date: 08/29/2020   PT End of Session - 08/29/20 0846    Visit Number 13    Number of Visits 20    Date for PT Re-Evaluation 09/22/20    PT Start Time 0845    PT Stop Time 0933    PT Time Calculation (min) 48 min    Activity Tolerance Patient tolerated treatment well           Past Medical History:  Diagnosis Date  . Hypertension     History reviewed. No pertinent surgical history.  There were no vitals filed for this visit.   Subjective Assessment - 08/29/20 0848    Subjective Patient reports that she is pain free this morning. Still notices that she has some pain with fatigue. Can rest and do some exercises and feels better.    Currently in Pain? No/denies    Pain Score 0-No pain                             [facility] Adult PT Treatment/Exercise - 08/29/20 0001      Lumbar Exercises: Stretches   Active Hamstring Stretch Right;Left;2 reps;30 seconds    Active Hamstring Stretch Limitations sittig hinged hips     Lower Trunk Rotation 5 reps      Lumbar Exercises: Standing   Functional Squats 10 reps;3 seconds    Functional Squats Limitations blue TB resistance holding band       Lumbar Exercises: Sidelying   Other Sidelying Lumbar Exercises open book x 5 ea      Knee/Hip Exercises: Standing   Lateral Step Up Right;Left;10 reps;Hand Hold: 0;Step Height: 6"    Forward Step Up Right;Left;10 reps;Step Height: 6"    Forward Step Up Limitations with yoga block overhead    Step Down Right;Left;10 reps;Hand Hold: 2;Step Height: 6"    Functional Squat 2 sets;10 reps;3 seconds   arms in ~ 90 deg flexion    SLS with Vectors SLS toe tap  fwd/side/back x 5 each LE       Shoulder Exercises: Standing   Row Strengthening;Both;10 reps;Theraband    Theraband Level (Shoulder Row) Level 4 (Blue)    Row Limitations isometric hold for step back 3 sec hold x 10 reps     Other Standing Exercises antirotation blue TB x 10 each side                        PT Long Term Goals - 08/13/20 1529      PT LONG TERM GOAL #1   Title Improve cervical and lumbar ROM to 60-75% in all ranges with minimal to no pain    Baseline Full neck ROM; Lumbar WNL and no pain with flexion today    Status Achieved      PT LONG TERM GOAL #2   Title Decrease pain to no more than 2/10 pain on the 0-10 pain scape with functional activities at work and home    Baseline morning is the worst pain and then pain at EOD with standing over sink    Status On-going      PT LONG TERM GOAL #3  Title Patient to report ability to sit or stand for 30-60 min with minimal to no increase pain    Baseline 45 min sitting today without pain; standing no limit    Status Partially Met      PT LONG TERM GOAL #4   Title Independent in HEP    Status Partially Met      PT LONG TERM GOAL #5   Title Improve FOTO to </= 39% limitation    Baseline 51% limited 07/16/20    Status On-going                 Plan - 08/29/20 0849    Clinical Impression Statement Continued improvement in functional activity level and being able to work more with less pain or fatigue. She is doing some exercises every day. Progressing gradually toward stated goals of therapy.    Rehab Potential Good    PT Frequency 2x / week    PT Duration 6 weeks    PT Treatment/Interventions ADLs/Self Care Home Management;Aquatic Therapy;Cryotherapy;Electrical Stimulation;Iontophoresis 4mg /ml Dexamethasone;Moist Heat;Ultrasound;Gait training;Stair training;Functional mobility training;Therapeutic activities;Therapeutic exercise;Balance training;Neuromuscular re-education;Patient/family  education;Manual techniques;Passive range of motion;Taping    PT Next Visit Plan continue with DN and STM as needed in gluteals and paraspinals; progress with core stabilization and strengthening    PT Home Exercise Plan [id number]    Consulted and Agree with Plan of Care Patient           Patient will benefit from skilled therapeutic intervention in order to improve the following deficits and impairments:     Visit Diagnosis: Acute bilateral low back pain with right-sided sciatica  Cervicalgia  Other symptoms and signs involving the musculoskeletal system  Abnormal posture     Problem List Patient Active Problem List   Diagnosis Date Noted  . Acute bilateral low back pain with sciatica 06/17/2020  . Neck pain 06/17/2020  . Mixed hyperlipidemia 09/21/2017  . Benign essential hypertension 03/11/2014  . Seasonal allergic rhinitis 03/11/2014  . Plantar fasciitis of left foot 03/30/2013    Iban Utz Nilda Simmer PT, MPH  08/29/2020, 9:15 AM  [facility] [place], [place], [address] Phone: [phone]   Fax:  [phone]  Name: PAYTON MODER MRN: [id number] Date of Birth: 1970/12/17

## 2020-09-01 ENCOUNTER — Encounter: Payer: BC Managed Care – PPO | Admitting: Rehabilitative and Restorative Service Providers"

## 2020-09-08 ENCOUNTER — Other Ambulatory Visit: Payer: Self-pay

## 2020-09-08 ENCOUNTER — Encounter: Payer: Self-pay | Admitting: Physical Therapy

## 2020-09-08 ENCOUNTER — Ambulatory Visit: Payer: BC Managed Care – PPO | Admitting: Physical Therapy

## 2020-09-08 DIAGNOSIS — R293 Abnormal posture: Secondary | ICD-10-CM

## 2020-09-08 DIAGNOSIS — M542 Cervicalgia: Secondary | ICD-10-CM

## 2020-09-08 DIAGNOSIS — M5441 Lumbago with sciatica, right side: Secondary | ICD-10-CM

## 2020-09-08 DIAGNOSIS — R29898 Other symptoms and signs involving the musculoskeletal system: Secondary | ICD-10-CM

## 2020-09-08 NOTE — Therapy (Signed)
[facility] [place] [address] [place], [place], [address] Phone: [phone]   Fax:  [phone]  Physical Therapy Treatment  Patient Details  Name: Renee Mcgrath MRN: [id number] Date of Birth: 10-07-1971 Referring Provider (PT): Dr Clare Gandy   Encounter Date: 09/08/2020   PT End of Session - 09/08/20 1658    Visit Number 14    Number of Visits 14    PT Start Time 1608    PT Stop Time 1640    PT Time Calculation (min) 32 min    Activity Tolerance Patient tolerated treatment well    Behavior During Therapy [facility] for tasks assessed/performed           Past Medical History:  Diagnosis Date  . Hypertension     History reviewed. No pertinent surgical history.  There were no vitals filed for this visit.   Subjective Assessment - 09/08/20 1614    Subjective Things have been going well, lifting heavy items is my biggest issue. I'm more conscious of my body mechanics now.    Pertinent History denies any medical or musculoskeletal problems    Patient Stated Goals get rid of the pain and return to PLOF    Currently in Pain? No/denies              [facility] PT Assessment - 09/08/20 0001      Observation/Other Assessments   Focus on Therapeutic Outcomes (FOTO)  43% limited       AROM   Cervical Flexion 60    Cervical Extension 40    Cervical - Right Side Bend 42    Cervical - Left Side Bend 45    Cervical - Right Rotation 70    Cervical - Left Rotation 70    Lumbar Flexion WFL     Lumbar Extension full     Lumbar - Right Side Bend very mild limitatoin    Lumbar - Left Side Bend full     Lumbar - Right Rotation very mild limitatoin     Lumbar - Left Rotation full       Strength   Overall Strength Comments 5/5 BLEs       Flexibility   Hamstrings WNL B    Quadriceps moderate limitations B     ITB moderate limitation     Piriformis WNL B                          [facility] Adult PT  Treatment/Exercise - 09/08/20 0001      Lumbar Exercises: Standing   Forward Lunge 10 reps    Forward Lunge Limitations blue ball press [place]     Side Lunge 10 reps    Side Lunge Limitations blue ball press [place]     Other Standing Lumbar Exercises regular squats and squats with hips in IR x10 each                   PT Education - 09/08/20 1657    Education Details progress toward goals, recommendation for today to be the last day of PT and transition to [facility]; role of good sleep and diet in reducing inflammation and pain; strategies to reduce pain at and after work    [employer]) Educated Patient    Methods Explanation;Demonstration    Comprehension Verbalized understanding;Returned demonstration               PT Long Term Goals - 09/08/20  [place] GOAL #1   Title Improve cervical and lumbar ROM to 60-75% in all ranges with minimal to no pain    Time 6    Period Weeks    Status Achieved      PT LONG TERM GOAL #2   Title Decrease pain to no more than 2/10 pain on the 0-10 pain scape with functional activities at work and home    Baseline late evening can be the worst depending on what her day has been like; mornings are manageable with stretching    Time 6    Period Weeks    Status Partially Met      PT LONG TERM GOAL #3   Title Patient to report ability to sit or stand for 30-60 min with minimal to no increase pain    Baseline over 30 but less than 60    Time 6    Period Weeks    Status Partially Met      PT LONG TERM GOAL #4   Title Independent in HEP    Time 6    Period Weeks    Status Achieved      PT LONG TERM GOAL #5   Title Improve FOTO to </= 39% limitation    Status On-going                 Plan - 09/08/20 1658    Clinical Impression Statement Ms. Renee Mcgrath arrives today reporting she is really doing well, she reports she is really able to do what she needs with most difficulty being in the morning or at the end of her day.  Discussed management strategies for her peak times of discomfort (stretches in the morning, HEP after a hot shower, alternating sitting and standing tasks at work), as well as role of sleep and diet in helping to reduce pain and inflammation. Reviewed goals- she has met many but has made limited progress towards the rest since her last goal review. Feel she would be best served by transition to [facility] for more advanced functional training with personal trainer at this time. DC today.    Personal Factors and Comorbidities Comorbidity 2    Comorbidities cervical and shoulder pain; LBP with radiculopathy; obesity    Examination-Activity Limitations Lift;Stand;Squat;Sit;Sleep;Reach Overhead    Examination-Participation Restrictions Cleaning;Laundry;Shop    Stability/Clinical Decision Making Evolving/Moderate complexity    Rehab Potential Good    PT Frequency 2x / week    PT Duration 6 weeks    PT Treatment/Interventions ADLs/Self Care Home Management;Aquatic Therapy;Cryotherapy;Electrical Stimulation;Iontophoresis 71m/ml Dexamethasone;Moist Heat;Ultrasound;Gait training;Stair training;Functional mobility training;Therapeutic activities;Therapeutic exercise;Balance training;Neuromuscular re-education;Patient/family education;Manual techniques;Passive range of motion;Taping    PT Next Visit Plan DC today[facility]           Patient will benefit from skilled therapeutic intervention in order to improve the following deficits and impairments:  Decreased range of motion, Increased muscle spasms, Obesity, Decreased activity tolerance, Pain, Hypomobility, Impaired flexibility, Improper body mechanics, Decreased mobility, Decreased strength, Impaired sensation, Postural dysfunction  Visit Diagnosis: Acute bilateral low back pain with right-sided sciatica  Cervicalgia  Other symptoms and signs involving the musculoskeletal system  Abnormal  posture     Problem List Patient Active Problem List   Diagnosis Date Noted  . Acute bilateral low back pain with sciatica 06/17/2020  . Neck pain 06/17/2020  . Mixed hyperlipidemia 09/21/2017  .  Benign essential hypertension 03/11/2014  . Seasonal allergic rhinitis 03/11/2014  . Plantar fasciitis of left foot 03/30/2013     PHYSICAL THERAPY DISCHARGE SUMMARY  Visits from Start of Care: 14  Current functional level related to goals / functional outcomes:  Generally able to do what she needs and wants with discomfort at some times of the day- discussed management strategies. Seems to have reached maximal benefit from skilled PT services at this time and will benefit from transition to regular exercise programming at [facility].    Remaining deficits: See above    Education / Equipment: See above  Plan: Patient agrees to discharge.  Patient goals were partially met. Patient is being discharged due to being pleased with the current functional level.  ?????        Windell Norfolk, DPT, PN1   Supplemental Physical Therapist [facility]    Pager [phone] Acute Rehab Office [facility] [place], [place], [address] Phone: [phone]   Fax:  [phone]  Name: Renee Mcgrath MRN: [id number] Date of Birth: 01-18-1971

## 2020-09-11 ENCOUNTER — Encounter: Payer: BC Managed Care – PPO | Admitting: Physical Therapy

## 2020-09-25 ENCOUNTER — Encounter: Payer: Self-pay | Admitting: Family Medicine

## 2020-09-25 ENCOUNTER — Ambulatory Visit (INDEPENDENT_AMBULATORY_CARE_PROVIDER_SITE_OTHER): Payer: BC Managed Care – PPO | Admitting: Family Medicine

## 2020-09-25 ENCOUNTER — Ambulatory Visit: Payer: BC Managed Care – PPO | Admitting: Family Medicine

## 2020-09-25 ENCOUNTER — Other Ambulatory Visit: Payer: Self-pay

## 2020-09-25 DIAGNOSIS — M5441 Lumbago with sciatica, right side: Secondary | ICD-10-CM | POA: Diagnosis not present

## 2020-09-25 NOTE — Progress Notes (Signed)
  NAKAIYA BEDDOW - 49 y.o. female MRN [id number]  Date of birth: [birth year]  SUBJECTIVE:  Including CC & ROS.  Chief Complaint  Patient presents with  . Follow-up    Bilateral low back    MELAINA HOWERTON is a 49 y.o. female that is following up for her low to mid back pain.  She has completed physical therapy.  She only gets intermittent and occasional pain in these areas.  She noticed the pain when she was hiking.  No radicular symptoms  Review of Systems See HPI   HISTORY: Past Medical, Surgical, Social, and Family History Reviewed & Updated per EMR.   Pertinent Historical Findings include:  Past Medical History:  Diagnosis Date  . Hypertension     No past surgical history on file.  Family History  Problem Relation Age of Onset  . Cancer Mother   . Multiple sclerosis Father   . Stroke Father     Social History   Socioeconomic History  . Marital status: Single    Spouse name: Not on file  . Number of children: Not on file  . Years of education: Not on file  . Highest education level: Not on file  Occupational History  . Not on file  Tobacco Use  . Smoking status: Never Smoker  . Smokeless tobacco: Never Used  Vaping Use  . Vaping Use: Never used  Substance and Sexual Activity  . Alcohol use: Not Currently  . Drug use: Never  . Sexual activity: Not on file  Other Topics Concern  . Not on file  Social History Narrative  . Not on file   Social Determinants of Health   Financial Resource Strain: Not on file  Food Insecurity: Not on file  Transportation Needs: Not on file  Physical Activity: Not on file  Stress: Not on file  Social Connections: Not on file  Intimate Partner Violence: Not on file     PHYSICAL EXAM:  VS: BP 125/79   Pulse 99   Ht 5\' 7"  (1.702 m)   Wt 280 lb (127 kg)   BMI 43.85 kg/m  Physical Exam Gen: NAD, alert, cooperative with exam, well-appearing   ASSESSMENT & PLAN:   Acute bilateral low back pain with sciatica Pain  has improved.  She has completed physical therapy.  Only has intermittent mild pain.  Not quite 100% yet. -Counseled on home exercise therapy and supportive care. -Try compression. -Can try chiropractor. -Increase workload with work note. -Could consider further imaging if needed.

## 2020-09-25 NOTE — Assessment & Plan Note (Signed)
Pain has improved.  She has completed physical therapy.  Only has intermittent mild pain.  Not quite 100% yet. -Counseled on home exercise therapy and supportive care. -Try compression. -Can try chiropractor. -Increase workload with work note. -Could consider further imaging if needed.

## 2020-09-25 NOTE — Patient Instructions (Signed)
Good to see you Please continue heat  Please try compression  Please try a chiropractor   Please send me a message in MyChart with any questions or updates.  Please see me back in 6 weeks.   --Dr. Jordan Likes
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
## 2023-01-26 ENCOUNTER — Encounter: Payer: Self-pay | Admitting: *Deleted
